# Patient Record
Sex: Female | Born: 1940 | Race: White | Hispanic: No | Smoking: Never smoker
Health system: Southern US, Community
[De-identification: ages and names within clinical notes are randomized; demographics above are authoritative.]

## PROBLEM LIST (undated history)

## (undated) DIAGNOSIS — M1990 Unspecified osteoarthritis, unspecified site: Secondary | ICD-10-CM

## (undated) DIAGNOSIS — R0600 Dyspnea, unspecified: Secondary | ICD-10-CM

## (undated) DIAGNOSIS — N816 Rectocele: Secondary | ICD-10-CM

## (undated) DIAGNOSIS — M2550 Pain in unspecified joint: Secondary | ICD-10-CM

## (undated) DIAGNOSIS — E785 Hyperlipidemia, unspecified: Secondary | ICD-10-CM

## (undated) DIAGNOSIS — F419 Anxiety disorder, unspecified: Secondary | ICD-10-CM

## (undated) DIAGNOSIS — I499 Cardiac arrhythmia, unspecified: Secondary | ICD-10-CM

## (undated) DIAGNOSIS — C801 Malignant (primary) neoplasm, unspecified: Secondary | ICD-10-CM

## (undated) DIAGNOSIS — R002 Palpitations: Secondary | ICD-10-CM

## (undated) DIAGNOSIS — K219 Gastro-esophageal reflux disease without esophagitis: Secondary | ICD-10-CM

## (undated) DIAGNOSIS — E079 Disorder of thyroid, unspecified: Secondary | ICD-10-CM

## (undated) DIAGNOSIS — E039 Hypothyroidism, unspecified: Secondary | ICD-10-CM

## (undated) HISTORY — PX: FINGER SURGERY: SHX640

## (undated) HISTORY — PX: CHOLECYSTECTOMY: SHX55

## (undated) HISTORY — PX: HAMMER TOE SURGERY: SHX385

## (undated) HISTORY — PX: CATARACT EXTRACTION, BILATERAL: SHX1313

## (undated) HISTORY — PX: ROTATOR CUFF REPAIR: SHX139

---

## 1969-08-11 HISTORY — PX: LIVER SURGERY: SHX698

## 1969-08-11 HISTORY — PX: MANDIBLE SURGERY: SHX707

## 1969-08-11 HISTORY — PX: AORTA SURGERY: SHX548

## 1998-04-30 ENCOUNTER — Other Ambulatory Visit: Admission: RE | Admit: 1998-04-30 | Discharge: 1998-04-30 | Payer: Self-pay

## 2011-12-08 DIAGNOSIS — Z79899 Other long term (current) drug therapy: Secondary | ICD-10-CM | POA: Diagnosis not present

## 2011-12-08 DIAGNOSIS — K219 Gastro-esophageal reflux disease without esophagitis: Secondary | ICD-10-CM | POA: Diagnosis not present

## 2011-12-08 DIAGNOSIS — R079 Chest pain, unspecified: Secondary | ICD-10-CM | POA: Diagnosis not present

## 2011-12-08 DIAGNOSIS — R300 Dysuria: Secondary | ICD-10-CM | POA: Diagnosis not present

## 2011-12-08 DIAGNOSIS — E039 Hypothyroidism, unspecified: Secondary | ICD-10-CM | POA: Diagnosis not present

## 2011-12-08 DIAGNOSIS — E785 Hyperlipidemia, unspecified: Secondary | ICD-10-CM | POA: Diagnosis not present

## 2012-04-02 DIAGNOSIS — R1013 Epigastric pain: Secondary | ICD-10-CM | POA: Diagnosis not present

## 2012-05-07 DIAGNOSIS — Z0000 Encounter for general adult medical examination without abnormal findings: Secondary | ICD-10-CM | POA: Diagnosis not present

## 2012-05-07 DIAGNOSIS — R300 Dysuria: Secondary | ICD-10-CM | POA: Diagnosis not present

## 2012-05-07 DIAGNOSIS — E785 Hyperlipidemia, unspecified: Secondary | ICD-10-CM | POA: Diagnosis not present

## 2012-05-07 DIAGNOSIS — Z1231 Encounter for screening mammogram for malignant neoplasm of breast: Secondary | ICD-10-CM | POA: Diagnosis not present

## 2012-05-07 DIAGNOSIS — E039 Hypothyroidism, unspecified: Secondary | ICD-10-CM | POA: Diagnosis not present

## 2012-05-13 DIAGNOSIS — K2900 Acute gastritis without bleeding: Secondary | ICD-10-CM | POA: Diagnosis not present

## 2012-05-13 DIAGNOSIS — R0602 Shortness of breath: Secondary | ICD-10-CM | POA: Diagnosis not present

## 2012-05-13 DIAGNOSIS — K2990 Gastroduodenitis, unspecified, without bleeding: Secondary | ICD-10-CM | POA: Diagnosis not present

## 2012-05-13 DIAGNOSIS — R002 Palpitations: Secondary | ICD-10-CM | POA: Diagnosis not present

## 2012-05-13 DIAGNOSIS — R0789 Other chest pain: Secondary | ICD-10-CM | POA: Diagnosis not present

## 2012-05-14 DIAGNOSIS — K219 Gastro-esophageal reflux disease without esophagitis: Secondary | ICD-10-CM | POA: Diagnosis not present

## 2012-05-14 DIAGNOSIS — F411 Generalized anxiety disorder: Secondary | ICD-10-CM | POA: Diagnosis not present

## 2012-05-14 DIAGNOSIS — M25519 Pain in unspecified shoulder: Secondary | ICD-10-CM | POA: Diagnosis not present

## 2012-05-24 DIAGNOSIS — K219 Gastro-esophageal reflux disease without esophagitis: Secondary | ICD-10-CM | POA: Diagnosis not present

## 2012-05-25 DIAGNOSIS — Z1231 Encounter for screening mammogram for malignant neoplasm of breast: Secondary | ICD-10-CM | POA: Diagnosis not present

## 2012-08-11 HISTORY — PX: OTHER SURGICAL HISTORY: SHX169

## 2012-11-17 DIAGNOSIS — M25519 Pain in unspecified shoulder: Secondary | ICD-10-CM | POA: Diagnosis not present

## 2012-11-22 DIAGNOSIS — M25569 Pain in unspecified knee: Secondary | ICD-10-CM | POA: Diagnosis not present

## 2012-11-22 DIAGNOSIS — M1710 Unilateral primary osteoarthritis, unspecified knee: Secondary | ICD-10-CM | POA: Diagnosis not present

## 2012-11-24 DIAGNOSIS — M25519 Pain in unspecified shoulder: Secondary | ICD-10-CM | POA: Diagnosis not present

## 2012-11-30 DIAGNOSIS — M25519 Pain in unspecified shoulder: Secondary | ICD-10-CM | POA: Diagnosis not present

## 2012-12-03 DIAGNOSIS — M25519 Pain in unspecified shoulder: Secondary | ICD-10-CM | POA: Diagnosis not present

## 2012-12-07 DIAGNOSIS — M25519 Pain in unspecified shoulder: Secondary | ICD-10-CM | POA: Diagnosis not present

## 2012-12-27 DIAGNOSIS — M7120 Synovial cyst of popliteal space [Baker], unspecified knee: Secondary | ICD-10-CM | POA: Diagnosis not present

## 2012-12-27 DIAGNOSIS — M6281 Muscle weakness (generalized): Secondary | ICD-10-CM | POA: Diagnosis not present

## 2012-12-27 DIAGNOSIS — R269 Unspecified abnormalities of gait and mobility: Secondary | ICD-10-CM | POA: Diagnosis not present

## 2012-12-27 DIAGNOSIS — M1710 Unilateral primary osteoarthritis, unspecified knee: Secondary | ICD-10-CM | POA: Diagnosis not present

## 2012-12-31 DIAGNOSIS — M1710 Unilateral primary osteoarthritis, unspecified knee: Secondary | ICD-10-CM | POA: Diagnosis not present

## 2012-12-31 DIAGNOSIS — S83289A Other tear of lateral meniscus, current injury, unspecified knee, initial encounter: Secondary | ICD-10-CM | POA: Diagnosis not present

## 2012-12-31 DIAGNOSIS — M25569 Pain in unspecified knee: Secondary | ICD-10-CM | POA: Diagnosis not present

## 2013-01-07 DIAGNOSIS — S83289A Other tear of lateral meniscus, current injury, unspecified knee, initial encounter: Secondary | ICD-10-CM | POA: Diagnosis not present

## 2013-01-07 DIAGNOSIS — M1710 Unilateral primary osteoarthritis, unspecified knee: Secondary | ICD-10-CM | POA: Diagnosis not present

## 2013-04-05 DIAGNOSIS — M1710 Unilateral primary osteoarthritis, unspecified knee: Secondary | ICD-10-CM | POA: Diagnosis not present

## 2013-04-05 DIAGNOSIS — M25569 Pain in unspecified knee: Secondary | ICD-10-CM | POA: Diagnosis not present

## 2013-04-05 DIAGNOSIS — S83289A Other tear of lateral meniscus, current injury, unspecified knee, initial encounter: Secondary | ICD-10-CM | POA: Diagnosis not present

## 2013-05-26 DIAGNOSIS — Z13820 Encounter for screening for osteoporosis: Secondary | ICD-10-CM | POA: Diagnosis not present

## 2013-05-26 DIAGNOSIS — E785 Hyperlipidemia, unspecified: Secondary | ICD-10-CM | POA: Diagnosis not present

## 2013-05-26 DIAGNOSIS — Z1231 Encounter for screening mammogram for malignant neoplasm of breast: Secondary | ICD-10-CM | POA: Diagnosis not present

## 2013-05-26 DIAGNOSIS — Z0000 Encounter for general adult medical examination without abnormal findings: Secondary | ICD-10-CM | POA: Diagnosis not present

## 2013-05-26 DIAGNOSIS — K5900 Constipation, unspecified: Secondary | ICD-10-CM | POA: Diagnosis not present

## 2013-05-26 DIAGNOSIS — K219 Gastro-esophageal reflux disease without esophagitis: Secondary | ICD-10-CM | POA: Diagnosis not present

## 2013-05-26 DIAGNOSIS — Z79899 Other long term (current) drug therapy: Secondary | ICD-10-CM | POA: Diagnosis not present

## 2013-05-26 DIAGNOSIS — E039 Hypothyroidism, unspecified: Secondary | ICD-10-CM | POA: Diagnosis not present

## 2013-06-27 DIAGNOSIS — D72819 Decreased white blood cell count, unspecified: Secondary | ICD-10-CM | POA: Diagnosis not present

## 2013-07-01 DIAGNOSIS — Z1231 Encounter for screening mammogram for malignant neoplasm of breast: Secondary | ICD-10-CM | POA: Diagnosis not present

## 2013-07-05 DIAGNOSIS — M25569 Pain in unspecified knee: Secondary | ICD-10-CM | POA: Diagnosis not present

## 2013-07-05 DIAGNOSIS — M1710 Unilateral primary osteoarthritis, unspecified knee: Secondary | ICD-10-CM | POA: Diagnosis not present

## 2013-07-05 DIAGNOSIS — S83289A Other tear of lateral meniscus, current injury, unspecified knee, initial encounter: Secondary | ICD-10-CM | POA: Diagnosis not present

## 2013-07-27 DIAGNOSIS — M899 Disorder of bone, unspecified: Secondary | ICD-10-CM | POA: Diagnosis not present

## 2013-07-27 DIAGNOSIS — Z13820 Encounter for screening for osteoporosis: Secondary | ICD-10-CM | POA: Diagnosis not present

## 2013-08-01 DIAGNOSIS — J019 Acute sinusitis, unspecified: Secondary | ICD-10-CM | POA: Diagnosis not present

## 2013-08-01 DIAGNOSIS — J209 Acute bronchitis, unspecified: Secondary | ICD-10-CM | POA: Diagnosis not present

## 2013-08-12 DIAGNOSIS — Z79899 Other long term (current) drug therapy: Secondary | ICD-10-CM | POA: Diagnosis not present

## 2013-08-12 DIAGNOSIS — M949 Disorder of cartilage, unspecified: Secondary | ICD-10-CM | POA: Diagnosis not present

## 2013-08-12 DIAGNOSIS — R7989 Other specified abnormal findings of blood chemistry: Secondary | ICD-10-CM | POA: Diagnosis not present

## 2013-08-12 DIAGNOSIS — M899 Disorder of bone, unspecified: Secondary | ICD-10-CM | POA: Diagnosis not present

## 2013-08-15 DIAGNOSIS — S83289A Other tear of lateral meniscus, current injury, unspecified knee, initial encounter: Secondary | ICD-10-CM | POA: Diagnosis not present

## 2013-08-15 DIAGNOSIS — M1710 Unilateral primary osteoarthritis, unspecified knee: Secondary | ICD-10-CM | POA: Diagnosis not present

## 2013-09-02 DIAGNOSIS — M1710 Unilateral primary osteoarthritis, unspecified knee: Secondary | ICD-10-CM | POA: Diagnosis not present

## 2013-09-09 DIAGNOSIS — M1710 Unilateral primary osteoarthritis, unspecified knee: Secondary | ICD-10-CM | POA: Diagnosis not present

## 2013-09-16 DIAGNOSIS — M1710 Unilateral primary osteoarthritis, unspecified knee: Secondary | ICD-10-CM | POA: Diagnosis not present

## 2013-10-28 DIAGNOSIS — M1710 Unilateral primary osteoarthritis, unspecified knee: Secondary | ICD-10-CM | POA: Diagnosis not present

## 2013-10-28 DIAGNOSIS — M25569 Pain in unspecified knee: Secondary | ICD-10-CM | POA: Diagnosis not present

## 2013-12-11 DIAGNOSIS — L259 Unspecified contact dermatitis, unspecified cause: Secondary | ICD-10-CM | POA: Diagnosis not present

## 2013-12-11 DIAGNOSIS — I8390 Asymptomatic varicose veins of unspecified lower extremity: Secondary | ICD-10-CM | POA: Diagnosis not present

## 2013-12-11 DIAGNOSIS — J069 Acute upper respiratory infection, unspecified: Secondary | ICD-10-CM | POA: Diagnosis not present

## 2013-12-11 DIAGNOSIS — R609 Edema, unspecified: Secondary | ICD-10-CM | POA: Diagnosis not present

## 2013-12-29 DIAGNOSIS — R609 Edema, unspecified: Secondary | ICD-10-CM | POA: Diagnosis not present

## 2014-04-03 DIAGNOSIS — L049 Acute lymphadenitis, unspecified: Secondary | ICD-10-CM | POA: Diagnosis not present

## 2014-04-03 DIAGNOSIS — R1013 Epigastric pain: Secondary | ICD-10-CM | POA: Diagnosis not present

## 2014-05-31 DIAGNOSIS — Z124 Encounter for screening for malignant neoplasm of cervix: Secondary | ICD-10-CM | POA: Diagnosis not present

## 2014-05-31 DIAGNOSIS — E785 Hyperlipidemia, unspecified: Secondary | ICD-10-CM | POA: Diagnosis not present

## 2014-05-31 DIAGNOSIS — K219 Gastro-esophageal reflux disease without esophagitis: Secondary | ICD-10-CM | POA: Diagnosis not present

## 2014-05-31 DIAGNOSIS — E039 Hypothyroidism, unspecified: Secondary | ICD-10-CM | POA: Diagnosis not present

## 2014-05-31 DIAGNOSIS — Z1231 Encounter for screening mammogram for malignant neoplasm of breast: Secondary | ICD-10-CM | POA: Diagnosis not present

## 2014-05-31 DIAGNOSIS — R8290 Unspecified abnormal findings in urine: Secondary | ICD-10-CM | POA: Diagnosis not present

## 2014-05-31 DIAGNOSIS — Z0001 Encounter for general adult medical examination with abnormal findings: Secondary | ICD-10-CM | POA: Diagnosis not present

## 2014-06-06 DIAGNOSIS — M1712 Unilateral primary osteoarthritis, left knee: Secondary | ICD-10-CM | POA: Diagnosis not present

## 2014-06-15 DIAGNOSIS — M25562 Pain in left knee: Secondary | ICD-10-CM | POA: Diagnosis not present

## 2014-06-15 DIAGNOSIS — M1712 Unilateral primary osteoarthritis, left knee: Secondary | ICD-10-CM | POA: Diagnosis not present

## 2014-06-15 DIAGNOSIS — R262 Difficulty in walking, not elsewhere classified: Secondary | ICD-10-CM | POA: Diagnosis not present

## 2014-06-27 DIAGNOSIS — L578 Other skin changes due to chronic exposure to nonionizing radiation: Secondary | ICD-10-CM | POA: Diagnosis not present

## 2014-06-27 DIAGNOSIS — L570 Actinic keratosis: Secondary | ICD-10-CM | POA: Diagnosis not present

## 2014-06-29 DIAGNOSIS — R262 Difficulty in walking, not elsewhere classified: Secondary | ICD-10-CM | POA: Diagnosis not present

## 2014-06-29 DIAGNOSIS — M1712 Unilateral primary osteoarthritis, left knee: Secondary | ICD-10-CM | POA: Diagnosis not present

## 2014-06-29 DIAGNOSIS — M25562 Pain in left knee: Secondary | ICD-10-CM | POA: Diagnosis not present

## 2014-07-04 DIAGNOSIS — M1712 Unilateral primary osteoarthritis, left knee: Secondary | ICD-10-CM | POA: Diagnosis not present

## 2014-07-04 DIAGNOSIS — R262 Difficulty in walking, not elsewhere classified: Secondary | ICD-10-CM | POA: Diagnosis not present

## 2014-07-04 DIAGNOSIS — M25562 Pain in left knee: Secondary | ICD-10-CM | POA: Diagnosis not present

## 2014-07-11 DIAGNOSIS — M1712 Unilateral primary osteoarthritis, left knee: Secondary | ICD-10-CM | POA: Diagnosis not present

## 2014-07-11 DIAGNOSIS — R262 Difficulty in walking, not elsewhere classified: Secondary | ICD-10-CM | POA: Diagnosis not present

## 2014-07-11 DIAGNOSIS — M25562 Pain in left knee: Secondary | ICD-10-CM | POA: Diagnosis not present

## 2014-07-18 DIAGNOSIS — Z1231 Encounter for screening mammogram for malignant neoplasm of breast: Secondary | ICD-10-CM | POA: Diagnosis not present

## 2014-07-25 DIAGNOSIS — M1712 Unilateral primary osteoarthritis, left knee: Secondary | ICD-10-CM | POA: Diagnosis not present

## 2014-07-25 DIAGNOSIS — R262 Difficulty in walking, not elsewhere classified: Secondary | ICD-10-CM | POA: Diagnosis not present

## 2014-07-25 DIAGNOSIS — M25562 Pain in left knee: Secondary | ICD-10-CM | POA: Diagnosis not present

## 2014-07-28 DIAGNOSIS — M1712 Unilateral primary osteoarthritis, left knee: Secondary | ICD-10-CM | POA: Diagnosis not present

## 2014-07-28 DIAGNOSIS — R262 Difficulty in walking, not elsewhere classified: Secondary | ICD-10-CM | POA: Diagnosis not present

## 2014-07-28 DIAGNOSIS — M25562 Pain in left knee: Secondary | ICD-10-CM | POA: Diagnosis not present

## 2014-08-17 DIAGNOSIS — J029 Acute pharyngitis, unspecified: Secondary | ICD-10-CM | POA: Diagnosis not present

## 2014-08-17 DIAGNOSIS — F419 Anxiety disorder, unspecified: Secondary | ICD-10-CM | POA: Diagnosis not present

## 2014-08-25 DIAGNOSIS — H25812 Combined forms of age-related cataract, left eye: Secondary | ICD-10-CM | POA: Diagnosis not present

## 2014-09-18 DIAGNOSIS — H6123 Impacted cerumen, bilateral: Secondary | ICD-10-CM | POA: Diagnosis not present

## 2014-09-25 DIAGNOSIS — M9902 Segmental and somatic dysfunction of thoracic region: Secondary | ICD-10-CM | POA: Diagnosis not present

## 2014-09-25 DIAGNOSIS — M9903 Segmental and somatic dysfunction of lumbar region: Secondary | ICD-10-CM | POA: Diagnosis not present

## 2014-09-25 DIAGNOSIS — M545 Low back pain: Secondary | ICD-10-CM | POA: Diagnosis not present

## 2014-09-25 DIAGNOSIS — M9905 Segmental and somatic dysfunction of pelvic region: Secondary | ICD-10-CM | POA: Diagnosis not present

## 2014-09-26 DIAGNOSIS — M9903 Segmental and somatic dysfunction of lumbar region: Secondary | ICD-10-CM | POA: Diagnosis not present

## 2014-09-26 DIAGNOSIS — M9905 Segmental and somatic dysfunction of pelvic region: Secondary | ICD-10-CM | POA: Diagnosis not present

## 2014-09-26 DIAGNOSIS — M545 Low back pain: Secondary | ICD-10-CM | POA: Diagnosis not present

## 2014-09-26 DIAGNOSIS — M9902 Segmental and somatic dysfunction of thoracic region: Secondary | ICD-10-CM | POA: Diagnosis not present

## 2014-09-27 DIAGNOSIS — M9903 Segmental and somatic dysfunction of lumbar region: Secondary | ICD-10-CM | POA: Diagnosis not present

## 2014-09-27 DIAGNOSIS — M545 Low back pain: Secondary | ICD-10-CM | POA: Diagnosis not present

## 2014-09-27 DIAGNOSIS — M9902 Segmental and somatic dysfunction of thoracic region: Secondary | ICD-10-CM | POA: Diagnosis not present

## 2014-09-27 DIAGNOSIS — M9905 Segmental and somatic dysfunction of pelvic region: Secondary | ICD-10-CM | POA: Diagnosis not present

## 2014-10-02 DIAGNOSIS — M9902 Segmental and somatic dysfunction of thoracic region: Secondary | ICD-10-CM | POA: Diagnosis not present

## 2014-10-02 DIAGNOSIS — M545 Low back pain: Secondary | ICD-10-CM | POA: Diagnosis not present

## 2014-10-02 DIAGNOSIS — M9905 Segmental and somatic dysfunction of pelvic region: Secondary | ICD-10-CM | POA: Diagnosis not present

## 2014-10-02 DIAGNOSIS — M9903 Segmental and somatic dysfunction of lumbar region: Secondary | ICD-10-CM | POA: Diagnosis not present

## 2014-10-03 DIAGNOSIS — K219 Gastro-esophageal reflux disease without esophagitis: Secondary | ICD-10-CM | POA: Diagnosis not present

## 2014-10-03 DIAGNOSIS — Z79899 Other long term (current) drug therapy: Secondary | ICD-10-CM | POA: Diagnosis not present

## 2014-10-03 DIAGNOSIS — H259 Unspecified age-related cataract: Secondary | ICD-10-CM | POA: Diagnosis not present

## 2014-10-03 DIAGNOSIS — E039 Hypothyroidism, unspecified: Secondary | ICD-10-CM | POA: Diagnosis not present

## 2014-10-03 DIAGNOSIS — H25812 Combined forms of age-related cataract, left eye: Secondary | ICD-10-CM | POA: Diagnosis not present

## 2014-10-03 DIAGNOSIS — M810 Age-related osteoporosis without current pathological fracture: Secondary | ICD-10-CM | POA: Diagnosis not present

## 2014-10-30 DIAGNOSIS — Z9842 Cataract extraction status, left eye: Secondary | ICD-10-CM | POA: Diagnosis not present

## 2014-10-30 DIAGNOSIS — H5203 Hypermetropia, bilateral: Secondary | ICD-10-CM | POA: Diagnosis not present

## 2014-10-30 DIAGNOSIS — H524 Presbyopia: Secondary | ICD-10-CM | POA: Diagnosis not present

## 2014-10-30 DIAGNOSIS — H52223 Regular astigmatism, bilateral: Secondary | ICD-10-CM | POA: Diagnosis not present

## 2014-10-30 DIAGNOSIS — H20012 Primary iridocyclitis, left eye: Secondary | ICD-10-CM | POA: Diagnosis not present

## 2014-10-30 DIAGNOSIS — Z961 Presence of intraocular lens: Secondary | ICD-10-CM | POA: Diagnosis not present

## 2014-10-30 DIAGNOSIS — H18232 Secondary corneal edema, left eye: Secondary | ICD-10-CM | POA: Diagnosis not present

## 2014-11-09 DIAGNOSIS — M1712 Unilateral primary osteoarthritis, left knee: Secondary | ICD-10-CM | POA: Diagnosis not present

## 2014-11-22 DIAGNOSIS — M179 Osteoarthritis of knee, unspecified: Secondary | ICD-10-CM | POA: Diagnosis not present

## 2014-11-22 DIAGNOSIS — Z01818 Encounter for other preprocedural examination: Secondary | ICD-10-CM | POA: Diagnosis not present

## 2014-11-22 DIAGNOSIS — B372 Candidiasis of skin and nail: Secondary | ICD-10-CM | POA: Diagnosis not present

## 2014-12-13 DIAGNOSIS — K859 Acute pancreatitis, unspecified: Secondary | ICD-10-CM | POA: Diagnosis not present

## 2014-12-13 DIAGNOSIS — R079 Chest pain, unspecified: Secondary | ICD-10-CM | POA: Diagnosis not present

## 2014-12-13 DIAGNOSIS — S3991XA Unspecified injury of abdomen, initial encounter: Secondary | ICD-10-CM | POA: Diagnosis not present

## 2014-12-13 DIAGNOSIS — S3993XA Unspecified injury of pelvis, initial encounter: Secondary | ICD-10-CM | POA: Diagnosis not present

## 2014-12-13 DIAGNOSIS — R1013 Epigastric pain: Secondary | ICD-10-CM | POA: Diagnosis not present

## 2014-12-18 DIAGNOSIS — K859 Acute pancreatitis, unspecified: Secondary | ICD-10-CM | POA: Diagnosis not present

## 2014-12-18 DIAGNOSIS — R112 Nausea with vomiting, unspecified: Secondary | ICD-10-CM | POA: Diagnosis not present

## 2014-12-18 DIAGNOSIS — R109 Unspecified abdominal pain: Secondary | ICD-10-CM | POA: Diagnosis not present

## 2014-12-26 DIAGNOSIS — R109 Unspecified abdominal pain: Secondary | ICD-10-CM | POA: Diagnosis not present

## 2014-12-26 DIAGNOSIS — R748 Abnormal levels of other serum enzymes: Secondary | ICD-10-CM | POA: Diagnosis not present

## 2014-12-26 DIAGNOSIS — R12 Heartburn: Secondary | ICD-10-CM | POA: Diagnosis not present

## 2014-12-27 DIAGNOSIS — R109 Unspecified abdominal pain: Secondary | ICD-10-CM | POA: Diagnosis not present

## 2014-12-27 DIAGNOSIS — R1013 Epigastric pain: Secondary | ICD-10-CM | POA: Diagnosis not present

## 2014-12-27 DIAGNOSIS — K2950 Unspecified chronic gastritis without bleeding: Secondary | ICD-10-CM | POA: Diagnosis not present

## 2014-12-27 DIAGNOSIS — K319 Disease of stomach and duodenum, unspecified: Secondary | ICD-10-CM | POA: Diagnosis not present

## 2015-01-04 DIAGNOSIS — M1712 Unilateral primary osteoarthritis, left knee: Secondary | ICD-10-CM | POA: Diagnosis not present

## 2015-01-04 DIAGNOSIS — M25562 Pain in left knee: Secondary | ICD-10-CM | POA: Diagnosis not present

## 2015-02-02 ENCOUNTER — Ambulatory Visit: Payer: Self-pay | Admitting: Orthopedic Surgery

## 2015-02-02 DIAGNOSIS — M1712 Unilateral primary osteoarthritis, left knee: Secondary | ICD-10-CM | POA: Diagnosis not present
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
## 2015-02-08 DIAGNOSIS — M9903 Segmental and somatic dysfunction of lumbar region: Secondary | ICD-10-CM | POA: Diagnosis not present

## 2015-02-08 DIAGNOSIS — M545 Low back pain: Secondary | ICD-10-CM | POA: Diagnosis not present

## 2015-02-08 DIAGNOSIS — M9905 Segmental and somatic dysfunction of pelvic region: Secondary | ICD-10-CM | POA: Diagnosis not present

## 2015-02-08 DIAGNOSIS — M9902 Segmental and somatic dysfunction of thoracic region: Secondary | ICD-10-CM | POA: Diagnosis not present

## 2015-02-12 DIAGNOSIS — M546 Pain in thoracic spine: Secondary | ICD-10-CM | POA: Diagnosis not present

## 2015-02-15 ENCOUNTER — Ambulatory Visit: Payer: Self-pay | Admitting: Orthopedic Surgery

## 2015-02-15 NOTE — Progress Notes (Signed)
Preoperative surgical orders have been place into the Epic hospital system for Robyn Lambert on 02/15/2015, 6:16 PM  by Mickel Crow for surgery on 02-28-2015.  Preop Total Knee orders including Experal, IV Tylenol, and IV Decadron as long as there are no contraindications to the above medications. Arlee Muslim, PA-C

## 2015-02-15 NOTE — Progress Notes (Signed)
Please put orders in Epic surgery 02-28-15 pre op 02-21-15 Thanks

## 2015-02-20 ENCOUNTER — Other Ambulatory Visit (HOSPITAL_COMMUNITY): Payer: Self-pay | Admitting: *Deleted

## 2015-02-20 DIAGNOSIS — R002 Palpitations: Secondary | ICD-10-CM

## 2015-02-20 NOTE — Patient Instructions (Addendum)
YOUR PROCEDURE IS SCHEDULED ON : 02/28/15  REPORT TO [facility] MAIN ENTRANCE FOLLOW SIGNS TO EAST ELEVATOR - GO TO 3rd FLOOR CHECK IN AT 3 EAST NURSES STATION (SHORT STAY) AT:  10:45 AM  CALL THIS NUMBER IF YOU HAVE PROBLEMS THE MORNING OF SURGERY [phone]  REMEMBER:ONLY 1 PER PERSON MAY GO TO SHORT STAY WITH YOU TO GET READY THE MORNING OF YOUR SURGERY  DO NOT EAT FOOD  AFTER MIDNIGHT  MAY HAVE CLEAR LIQUIDS UNTIL 6:45 AM  TAKE THESE MEDICINES THE MORNING OF SURGERY: SYNTHROID / MAY TAKE TRAMADOL IF NEEDED     CLEAR LIQUID DIET   Foods Allowed                                                                     Foods Excluded  Coffee and tea, regular and decaf                             liquids that you cannot  Plain Jell-O in any flavor                                             see through such as: Fruit ices (not with fruit pulp)                                     milk, soups, orange juice  Iced Popsicles                                                 All solid food Carbonated beverages, regular and diet                                    Cranberry, grape and apple juices Sports drinks like Gatorade Lightly seasoned clear broth or consume(fat free) Sugar, honey syrup   _____________________________________________________________________    YOU MAY NOT HAVE ANY METAL ON YOUR BODY INCLUDING HAIR PINS AND PIERCING'S. DO NOT WEAR JEWELRY, MAKEUP, LOTIONS, POWDERS OR PERFUMES. DO NOT WEAR NAIL POLISH. DO NOT SHAVE 48 HRS PRIOR TO SURGERY. MEN MAY SHAVE FACE AND NECK.  DO NOT [place]. [facility] IS NOT RESPONSIBLE FOR VALUABLES.  CONTACTS, DENTURES OR PARTIALS MAY NOT BE WORN TO SURGERY. LEAVE SUITCASE IN CAR. CAN BE BROUGHT TO ROOM AFTER SURGERY.  PATIENTS DISCHARGED THE DAY OF SURGERY WILL NOT BE ALLOWED TO DRIVE HOME.  PLEASE READ OVER THE FOLLOWING INSTRUCTION  SHEETS _________________________________________________________________________________                                          [place] - PREPARING FOR SURGERY  Before surgery, you can play an  important role.  Because skin is not sterile, your skin needs to be as free of germs as possible.  You can reduce the number of germs on your skin by washing with CHG (chlorahexidine gluconate) soap before surgery.  CHG is an antiseptic cleaner which kills germs and bonds with the skin to continue killing germs even after washing. Please DO NOT use if you have an allergy to CHG or antibacterial soaps.  If your skin becomes reddened/irritated stop using the CHG and inform your nurse when you arrive at Short Stay. Do not shave (including legs and underarms) for at least 48 hours prior to the first CHG shower.  You may shave your face. Please follow these instructions carefully:   1.  Shower with CHG Soap the night before surgery and the  morning of Surgery.   2.  If you choose to wash your hair, wash your hair first as usual with your  normal  Shampoo.   3.  After you shampoo, rinse your hair and body thoroughly to remove the  shampoo.                                         4.  Use CHG as you would any other liquid soap.  You can apply chg directly  to the skin and wash . Gently wash with scrungie or clean wascloth    5.  Apply the CHG Soap to your body ONLY FROM THE NECK DOWN.   Do not use on open                           Wound or open sores. Avoid contact with eyes, ears mouth and genitals (private parts).                        Genitals (private parts) with your normal soap.              6.  Wash thoroughly, paying special attention to the area where your surgery  will be performed.   7.  Thoroughly rinse your body with warm water from the neck down.   8.  DO NOT shower/wash with your normal soap after using and rinsing off  the CHG Soap .                9.  Pat yourself dry with a clean  towel.             10.  Wear clean night clothes to bed after shower             11.  Place clean sheets on your bed the night of your first shower and do not  sleep with pets.  Day of Surgery : Do not apply any lotions/deodorants the morning of surgery.  Please wear clean clothes to the hospital/surgery center.  FAILURE TO FOLLOW THESE INSTRUCTIONS MAY RESULT IN THE CANCELLATION OF YOUR SURGERY    PATIENT SIGNATURE_________________________________  ______________________________________________________________________     [name]  An incentive spirometer is a tool that can help keep your lungs clear and active. This tool measures how well you are filling your lungs with each breath. Taking long deep breaths may help reverse or decrease the chance of developing breathing (pulmonary) problems (especially infection) following:  A long period of time when you are  unable to move or be active. BEFORE THE PROCEDURE   If the spirometer includes an indicator to show your best effort, your nurse or respiratory therapist will set it to a desired goal.  If possible, sit up straight or lean slightly forward. Try not to slouch.  Hold the incentive spirometer in an upright position. INSTRUCTIONS FOR USE   Sit on the edge of your bed if possible, or sit up as far as you can in bed or on a chair.  Hold the incentive spirometer in an upright position.  Breathe out normally.  Place the mouthpiece in your mouth and seal your lips tightly around it.  Breathe in slowly and as deeply as possible, raising the piston or the ball toward the top of the column.  Hold your breath for 3-5 seconds or for as long as possible. Allow the piston or ball to fall to the bottom of the column.  Remove the mouthpiece from your mouth and breathe out normally.  Rest for a few seconds and repeat Steps 1 through 7 at least 10 times every 1-2 hours when you are awake. Take your time and take a few  normal breaths between deep breaths.  The spirometer may include an indicator to show your best effort. Use the indicator as a goal to work toward during each repetition.  After each set of 10 deep breaths, practice coughing to be sure your lungs are clear. If you have an incision (the cut made at the time of surgery), support your incision when coughing by placing a pillow or rolled up towels firmly against it. Once you are able to get out of bed, walk around indoors and cough well. You may stop using the incentive spirometer when instructed by your caregiver.  RISKS AND COMPLICATIONS  Take your time so you do not get dizzy or light-headed.  If you are in pain, you may need to take or ask for pain medication before doing incentive spirometry. It is harder to take a deep breath if you are having pain. AFTER USE  Rest and breathe slowly and easily.  It can be helpful to keep track of a log of your progress. Your caregiver can provide you with a simple table to help with this. If you are using the spirometer at home, follow these instructions: [place] IF:   You are having difficultly using the spirometer.  You have trouble using the spirometer as often as instructed.  Your pain medication is not giving enough relief while using the spirometer.  You develop fever of 100.5 F (38.1 C) or higher. SEEK IMMEDIATE MEDICAL CARE IF:   You cough up bloody sputum that had not been present before.  You develop fever of 102 F (38.9 C) or greater.  You develop worsening pain at or near the incision site. MAKE SURE YOU:   Understand these instructions.  Will watch your condition.  Will get help right away if you are not doing well or get worse. Document Released: 12/08/2006 Document Revised: 10/20/2011 Document Reviewed: 02/08/2007 Exit[employer] Patient Information 2014 [employer], [place].   ________________________________________________________________________  WHAT IS A BLOOD  TRANSFUSION? Blood Transfusion Information  A transfusion is the replacement of blood or some of its parts. Blood is made up of multiple cells which provide different functions.  Red blood cells carry oxygen and are used for blood loss replacement.  White blood cells fight against infection.  Platelets control bleeding.  Plasma helps clot blood.  Other blood products  are available for specialized needs, such as hemophilia or other clotting disorders. BEFORE THE TRANSFUSION  Who gives blood for transfusions?   Healthy volunteers who are fully evaluated to make sure their blood is safe. This is blood bank blood. Transfusion therapy is the safest it has ever been in the practice of medicine. Before blood is taken from a donor, a complete history is taken to make sure that person has no history of diseases nor engages in risky social behavior (examples are intravenous drug use or sexual activity with multiple partners). The donor's travel history is screened to minimize risk of transmitting infections, such as malaria. The donated blood is tested for signs of infectious diseases, such as HIV and hepatitis. The blood is then tested to be sure it is compatible with you in order to minimize the chance of a transfusion reaction. If you or a relative donates blood, this is often done in anticipation of surgery and is not appropriate for emergency situations. It takes many days to process the donated blood. RISKS AND COMPLICATIONS Although transfusion therapy is very safe and saves many lives, the main dangers of transfusion include:   Getting an infectious disease.  Developing a transfusion reaction. This is an allergic reaction to something in the blood you were given. Every precaution is taken to prevent this. The decision to have a blood transfusion has been considered carefully by your caregiver before blood is given. Blood is not given unless the benefits outweigh the risks. AFTER THE  TRANSFUSION  Right after receiving a blood transfusion, you will usually feel much better and more energetic. This is especially true if your red blood cells have gotten low (anemic). The transfusion raises the level of the red blood cells which carry oxygen, and this usually causes an energy increase.  The nurse administering the transfusion will monitor you carefully for complications. HOME CARE INSTRUCTIONS  No special instructions are needed after a transfusion. You may find your energy is better. Speak with your caregiver about any limitations on activity for underlying diseases you may have. SEEK MEDICAL CARE IF:   Your condition is not improving after your transfusion.  You develop redness or irritation at the intravenous (IV) site. SEEK IMMEDIATE MEDICAL CARE IF:  Any of the following symptoms occur over the next 12 hours:  Shaking chills.  You have a temperature by mouth above 102 F (38.9 C), not controlled by medicine.  Chest, back, or muscle pain.  People around you feel you are not acting correctly or are confused.  Shortness of breath or difficulty breathing.  Dizziness and fainting.  You get a rash or develop hives.  You have a decrease in urine output.  Your urine turns a dark color or changes to pink, red, or brown. Any of the following symptoms occur over the next 10 days:  You have a temperature by mouth above 102 F (38.9 C), not controlled by medicine.  Shortness of breath.  Weakness after normal activity.  The white part of the eye turns yellow (jaundice).  You have a decrease in the amount of urine or are urinating less often.  Your urine turns a dark color or changes to pink, red, or brown. Document Released: 07/25/2000 Document Revised: 10/20/2011 Document Reviewed: 03/13/2008 [facility] Patient Information 2014 [place], [place].  _______________________________________________________________________

## 2015-02-21 ENCOUNTER — Encounter (HOSPITAL_COMMUNITY)
Admission: RE | Admit: 2015-02-21 | Discharge: 2015-02-21 | Disposition: A | Discharge status: Home / Self Care | Payer: Medicare Other | Source: Ambulatory Visit | Attending: Orthopedic Surgery | Admitting: Orthopedic Surgery

## 2015-02-21 ENCOUNTER — Encounter (HOSPITAL_COMMUNITY): Payer: Self-pay

## 2015-02-21 DIAGNOSIS — Z01812 Encounter for preprocedural laboratory examination: Secondary | ICD-10-CM | POA: Insufficient documentation

## 2015-02-21 DIAGNOSIS — Z79899 Other long term (current) drug therapy: Secondary | ICD-10-CM | POA: Diagnosis not present

## 2015-02-21 DIAGNOSIS — Z0183 Encounter for blood typing: Secondary | ICD-10-CM | POA: Insufficient documentation

## 2015-02-21 DIAGNOSIS — M1712 Unilateral primary osteoarthritis, left knee: Secondary | ICD-10-CM | POA: Insufficient documentation

## 2015-02-21 DIAGNOSIS — Z7982 Long term (current) use of aspirin: Secondary | ICD-10-CM | POA: Insufficient documentation

## 2015-02-21 HISTORY — DX: Hyperlipidemia, unspecified: E78.5

## 2015-02-21 HISTORY — DX: Unspecified osteoarthritis, unspecified site: M19.90

## 2015-02-21 HISTORY — DX: Rectocele: N81.6

## 2015-02-21 HISTORY — DX: Gastro-esophageal reflux disease without esophagitis: K21.9

## 2015-02-21 HISTORY — DX: Disorder of thyroid, unspecified: E07.9

## 2015-02-21 HISTORY — DX: Pain in unspecified joint: M25.50

## 2015-02-21 HISTORY — DX: Anxiety disorder, unspecified: F41.9

## 2015-02-21 LAB — URINALYSIS, ROUTINE W REFLEX MICROSCOPIC
Bilirubin Urine: NEGATIVE
GLUCOSE, UA: NEGATIVE mg/dL
Hgb urine dipstick: NEGATIVE
Ketones, ur: NEGATIVE mg/dL
LEUKOCYTES UA: NEGATIVE
Nitrite: NEGATIVE
PH: 7 (ref 5.0–8.0)
Protein, ur: NEGATIVE mg/dL
SPECIFIC GRAVITY, URINE: 1.005 (ref 1.005–1.030)
UROBILINOGEN UA: 0.2 mg/dL (ref 0.0–1.0)

## 2015-02-21 LAB — PROTIME-INR
INR: 0.97 (ref 0.00–1.49)
Prothrombin Time: 13.1 seconds (ref 11.6–15.2)

## 2015-02-21 LAB — COMPREHENSIVE METABOLIC PANEL
ALT: 14 U/L (ref 14–54)
ANION GAP: 8 (ref 5–15)
AST: 21 U/L (ref 15–41)
Albumin: 4.4 g/dL (ref 3.5–5.0)
Alkaline Phosphatase: 121 U/L (ref 38–126)
BUN: 14 mg/dL (ref 6–20)
CALCIUM: 9.6 mg/dL (ref 8.9–10.3)
CHLORIDE: 103 mmol/L (ref 101–111)
CO2: 30 mmol/L (ref 22–32)
Creatinine, Ser: 0.63 mg/dL (ref 0.44–1.00)
GFR calc Af Amer: >60 mL/min (ref >60)
GFR calc non Af Amer: >60 mL/min (ref >60)
GLUCOSE: 109 mg/dL — AB (ref 65–99)
POTASSIUM: 4.3 mmol/L (ref 3.5–5.1)
Sodium: 141 mmol/L (ref 135–145)
TOTAL PROTEIN: 7.4 g/dL (ref 6.5–8.1)
Total Bilirubin: 0.4 mg/dL (ref 0.3–1.2)

## 2015-02-21 LAB — CBC
HEMATOCRIT: 39.1 % (ref 36.0–46.0)
Hemoglobin: 12.6 g/dL (ref 12.0–15.0)
MCH: 28.7 pg (ref 26.0–34.0)
MCHC: 32.2 g/dL (ref 30.0–36.0)
MCV: 89.1 fL (ref 78.0–100.0)
Platelets: 261 10*3/uL (ref 150–400)
RBC: 4.39 MIL/uL (ref 3.87–5.11)
RDW: 13.4 % (ref 11.5–15.5)
WBC: 4.8 10*3/uL (ref 4.0–10.5)

## 2015-02-21 LAB — APTT: aPTT: 34 seconds (ref 24–37)

## 2015-02-21 LAB — SURGICAL PCR SCREEN
MRSA, PCR: NEGATIVE
Staphylococcus aureus: NEGATIVE

## 2015-02-21 LAB — ABO/RH: ABO/RH(D): O POS

## 2015-02-28 ENCOUNTER — Encounter (HOSPITAL_COMMUNITY): Payer: Self-pay | Admitting: Anesthesiology

## 2015-02-28 ENCOUNTER — Encounter (HOSPITAL_COMMUNITY)
Admission: AD | Disposition: A | Discharge status: Skilled Nursing Facility | Payer: Self-pay | Source: Ambulatory Visit | Attending: Orthopedic Surgery

## 2015-02-28 ENCOUNTER — Inpatient Hospital Stay (HOSPITAL_COMMUNITY): Payer: Medicare Other | Admitting: Anesthesiology

## 2015-02-28 ENCOUNTER — Inpatient Hospital Stay (HOSPITAL_COMMUNITY)
Admission: AD | Admit: 2015-02-28 | Discharge: 2015-03-02 | DRG: 470 | Disposition: A | Discharge status: Skilled Nursing Facility | Payer: Medicare Other | Source: Ambulatory Visit | Attending: Orthopedic Surgery | Admitting: Orthopedic Surgery

## 2015-02-28 DIAGNOSIS — M1712 Unilateral primary osteoarthritis, left knee: Secondary | ICD-10-CM | POA: Diagnosis present

## 2015-02-28 DIAGNOSIS — E785 Hyperlipidemia, unspecified: Secondary | ICD-10-CM | POA: Diagnosis present

## 2015-02-28 DIAGNOSIS — E780 Pure hypercholesterolemia: Secondary | ICD-10-CM | POA: Diagnosis present

## 2015-02-28 DIAGNOSIS — Z882 Allergy status to sulfonamides status: Secondary | ICD-10-CM

## 2015-02-28 DIAGNOSIS — Z7982 Long term (current) use of aspirin: Secondary | ICD-10-CM | POA: Diagnosis not present

## 2015-02-28 DIAGNOSIS — F419 Anxiety disorder, unspecified: Secondary | ICD-10-CM | POA: Diagnosis present

## 2015-02-28 DIAGNOSIS — M2550 Pain in unspecified joint: Secondary | ICD-10-CM | POA: Diagnosis not present

## 2015-02-28 DIAGNOSIS — Z886 Allergy status to analgesic agent status: Secondary | ICD-10-CM

## 2015-02-28 DIAGNOSIS — M179 Osteoarthritis of knee, unspecified: Secondary | ICD-10-CM | POA: Diagnosis not present

## 2015-02-28 DIAGNOSIS — K219 Gastro-esophageal reflux disease without esophagitis: Secondary | ICD-10-CM | POA: Diagnosis present

## 2015-02-28 DIAGNOSIS — Z96652 Presence of left artificial knee joint: Secondary | ICD-10-CM | POA: Diagnosis not present

## 2015-02-28 DIAGNOSIS — M1710 Unilateral primary osteoarthritis, unspecified knee: Secondary | ICD-10-CM | POA: Diagnosis present

## 2015-02-28 DIAGNOSIS — Z471 Aftercare following joint replacement surgery: Secondary | ICD-10-CM | POA: Diagnosis not present

## 2015-02-28 DIAGNOSIS — M25562 Pain in left knee: Secondary | ICD-10-CM | POA: Diagnosis not present

## 2015-02-28 HISTORY — PX: TOTAL KNEE ARTHROPLASTY: SHX125

## 2015-02-28 LAB — TYPE AND SCREEN
ABO/RH(D): O POS
ANTIBODY SCREEN: NEGATIVE

## 2015-02-28 SURGERY — ARTHROPLASTY, KNEE, TOTAL
Anesthesia: Spinal | Site: Knee | Laterality: Left

## 2015-02-28 MED ORDER — MIDAZOLAM HCL 2 MG/2ML IJ SOLN
INTRAMUSCULAR | Status: AC
  Filled 2015-02-28: qty 2

## 2015-02-28 MED ORDER — SODIUM CHLORIDE 0.9 % IJ SOLN
INTRAMUSCULAR | Status: AC
  Filled 2015-02-28: qty 10

## 2015-02-28 MED ORDER — METOCLOPRAMIDE HCL 5 MG/ML IJ SOLN: 5.0000 mg | Freq: Three times a day (TID) | INTRAMUSCULAR | Status: DC | PRN

## 2015-02-28 MED ORDER — LACTATED RINGERS IV SOLN
INTRAVENOUS | Status: DC | PRN
  Administered 2015-02-28: 12:00:00 via INTRAVENOUS

## 2015-02-28 MED ORDER — CEFAZOLIN SODIUM-DEXTROSE 2-3 GM-% IV SOLR
2.0000 g | Freq: Four times a day (QID) | INTRAVENOUS | Status: AC
  Administered 2015-02-28 – 2015-03-01 (×2): 2 g via INTRAVENOUS
  Filled 2015-02-28 (×2): qty 50

## 2015-02-28 MED ORDER — SODIUM CHLORIDE 0.9 % IJ SOLN
INTRAMUSCULAR | Status: AC
  Filled 2015-02-28: qty 50

## 2015-02-28 MED ORDER — ONDANSETRON HCL 4 MG/2ML IJ SOLN: 4.0000 mg | Freq: Four times a day (QID) | INTRAMUSCULAR | Status: DC | PRN

## 2015-02-28 MED ORDER — BUPIVACAINE LIPOSOME 1.3 % IJ SUSP
20.0000 mL | Freq: Once | INTRAMUSCULAR | Status: DC
  Filled 2015-02-28: qty 20

## 2015-02-28 MED ORDER — CHLORHEXIDINE GLUCONATE 4 % EX LIQD: 60.0000 mL | Freq: Once | CUTANEOUS | Status: DC

## 2015-02-28 MED ORDER — DIPHENHYDRAMINE HCL 12.5 MG/5ML PO ELIX: 12.5000 mg | ORAL_SOLUTION | ORAL | Status: DC | PRN

## 2015-02-28 MED ORDER — FENTANYL CITRATE (PF) 100 MCG/2ML IJ SOLN
INTRAMUSCULAR | Status: DC | PRN
  Administered 2015-02-28 (×2): 25 ug via INTRAVENOUS
  Administered 2015-02-28: 50 ug via INTRAVENOUS

## 2015-02-28 MED ORDER — ACETAMINOPHEN 650 MG RE SUPP: 650.0000 mg | Freq: Four times a day (QID) | RECTAL | Status: DC | PRN

## 2015-02-28 MED ORDER — DOCUSATE SODIUM 100 MG PO CAPS
100.0000 mg | ORAL_CAPSULE | Freq: Two times a day (BID) | ORAL | Status: DC
  Administered 2015-02-28 – 2015-03-02 (×4): 100 mg via ORAL

## 2015-02-28 MED ORDER — POLYETHYLENE GLYCOL 3350 17 G PO PACK: 17.0000 g | PACK | Freq: Every day | ORAL | Status: DC | PRN

## 2015-02-28 MED ORDER — PHENOL 1.4 % MT LIQD: 1.0000 | OROMUCOSAL | Status: DC | PRN

## 2015-02-28 MED ORDER — MIDAZOLAM HCL 5 MG/5ML IJ SOLN
INTRAMUSCULAR | Status: DC | PRN
  Administered 2015-02-28: 2 mg via INTRAVENOUS

## 2015-02-28 MED ORDER — ONDANSETRON HCL 4 MG/2ML IJ SOLN
INTRAMUSCULAR | Status: DC | PRN
  Administered 2015-02-28: 4 mg via INTRAVENOUS

## 2015-02-28 MED ORDER — BUPIVACAINE HCL 0.25 % IJ SOLN
INTRAMUSCULAR | Status: DC | PRN
  Administered 2015-02-28: 30 mL

## 2015-02-28 MED ORDER — DEXAMETHASONE SODIUM PHOSPHATE 10 MG/ML IJ SOLN
10.0000 mg | Freq: Once | INTRAMUSCULAR | Status: AC
  Administered 2015-02-28: 10 mg via INTRAVENOUS

## 2015-02-28 MED ORDER — ACETAMINOPHEN 325 MG PO TABS: 650.0000 mg | ORAL_TABLET | Freq: Four times a day (QID) | ORAL | Status: DC | PRN

## 2015-02-28 MED ORDER — HYDROMORPHONE HCL 1 MG/ML IJ SOLN: 0.2500 mg | INTRAMUSCULAR | Status: DC | PRN

## 2015-02-28 MED ORDER — CEFAZOLIN SODIUM-DEXTROSE 2-3 GM-% IV SOLR
INTRAVENOUS | Status: AC
  Filled 2015-02-28: qty 50

## 2015-02-28 MED ORDER — ONDANSETRON HCL 4 MG/2ML IJ SOLN
INTRAMUSCULAR | Status: AC
  Filled 2015-02-28: qty 2

## 2015-02-28 MED ORDER — DEXAMETHASONE SODIUM PHOSPHATE 10 MG/ML IJ SOLN
INTRAMUSCULAR | Status: AC
  Filled 2015-02-28: qty 1

## 2015-02-28 MED ORDER — PROPOFOL 10 MG/ML IV BOLUS
INTRAVENOUS | Status: AC
  Filled 2015-02-28: qty 20

## 2015-02-28 MED ORDER — CEFAZOLIN SODIUM-DEXTROSE 2-3 GM-% IV SOLR
2.0000 g | INTRAVENOUS | Status: AC
  Administered 2015-02-28: 2 g via INTRAVENOUS

## 2015-02-28 MED ORDER — FLEET ENEMA 7-19 GM/118ML RE ENEM: 1.0000 | ENEMA | Freq: Once | RECTAL | Status: AC | PRN

## 2015-02-28 MED ORDER — ACETAMINOPHEN 10 MG/ML IV SOLN
INTRAVENOUS | Status: AC
  Filled 2015-02-28: qty 100

## 2015-02-28 MED ORDER — POLYETHYLENE GLYCOL 3350 17 G PO PACK
17.0000 g | PACK | Freq: Two times a day (BID) | ORAL | Status: DC
  Administered 2015-02-28 – 2015-03-02 (×4): 17 g via ORAL

## 2015-02-28 MED ORDER — PROPOFOL INFUSION 10 MG/ML OPTIME
INTRAVENOUS | Status: DC | PRN
  Administered 2015-02-28: 50 ug/kg/min via INTRAVENOUS

## 2015-02-28 MED ORDER — ACETAMINOPHEN 10 MG/ML IV SOLN
1000.0000 mg | Freq: Once | INTRAVENOUS | Status: AC
  Administered 2015-02-28: 1000 mg via INTRAVENOUS

## 2015-02-28 MED ORDER — ONDANSETRON HCL 4 MG PO TABS: 4.0000 mg | ORAL_TABLET | Freq: Four times a day (QID) | ORAL | Status: DC | PRN

## 2015-02-28 MED ORDER — SODIUM CHLORIDE 0.9 % IR SOLN
Status: DC | PRN
  Administered 2015-02-28: 1000 mL

## 2015-02-28 MED ORDER — MENTHOL 3 MG MT LOZG: 1.0000 | LOZENGE | OROMUCOSAL | Status: DC | PRN

## 2015-02-28 MED ORDER — OXYCODONE HCL 5 MG PO TABS
5.0000 mg | ORAL_TABLET | ORAL | Status: DC | PRN
  Administered 2015-02-28 – 2015-03-02 (×10): 10 mg via ORAL
  Filled 2015-02-28 (×10): qty 2

## 2015-02-28 MED ORDER — MORPHINE SULFATE 2 MG/ML IJ SOLN
1.0000 mg | INTRAMUSCULAR | Status: DC | PRN
  Administered 2015-02-28 – 2015-03-01 (×4): 2 mg via INTRAVENOUS
  Filled 2015-02-28 (×4): qty 1

## 2015-02-28 MED ORDER — SODIUM CHLORIDE 0.9 % IV SOLN
INTRAVENOUS | Status: DC
  Administered 2015-02-28: 75 mL/h via INTRAVENOUS

## 2015-02-28 MED ORDER — METOCLOPRAMIDE HCL 10 MG PO TABS: 5.0000 mg | ORAL_TABLET | Freq: Three times a day (TID) | ORAL | Status: DC | PRN

## 2015-02-28 MED ORDER — TRAMADOL HCL 50 MG PO TABS
50.0000 mg | ORAL_TABLET | Freq: Four times a day (QID) | ORAL | Status: DC | PRN
  Administered 2015-02-28 – 2015-03-01 (×2): 100 mg via ORAL
  Filled 2015-02-28 (×2): qty 2

## 2015-02-28 MED ORDER — SODIUM CHLORIDE 0.9 % IJ SOLN
INTRAMUSCULAR | Status: DC | PRN
  Administered 2015-02-28: 30 mL

## 2015-02-28 MED ORDER — ACETAMINOPHEN 500 MG PO TABS
1000.0000 mg | ORAL_TABLET | Freq: Four times a day (QID) | ORAL | Status: AC
  Administered 2015-02-28 – 2015-03-01 (×4): 1000 mg via ORAL
  Filled 2015-02-28 (×4): qty 2

## 2015-02-28 MED ORDER — RIVAROXABAN 10 MG PO TABS
10.0000 mg | ORAL_TABLET | Freq: Every day | ORAL | Status: DC
  Administered 2015-03-01 – 2015-03-02 (×2): 10 mg via ORAL
  Filled 2015-02-28 (×3): qty 1

## 2015-02-28 MED ORDER — BISACODYL 10 MG RE SUPP: 10.0000 mg | Freq: Every day | RECTAL | Status: DC | PRN

## 2015-02-28 MED ORDER — METHOCARBAMOL 1000 MG/10ML IJ SOLN
500.0000 mg | Freq: Four times a day (QID) | INTRAVENOUS | Status: DC | PRN
  Administered 2015-02-28: 500 mg via INTRAVENOUS
  Filled 2015-02-28 (×2): qty 5

## 2015-02-28 MED ORDER — 0.9 % SODIUM CHLORIDE (POUR BTL) OPTIME
TOPICAL | Status: DC | PRN
  Administered 2015-02-28: 1000 mL

## 2015-02-28 MED ORDER — SODIUM CHLORIDE 0.9 % IV SOLN: INTRAVENOUS | Status: DC

## 2015-02-28 MED ORDER — EPHEDRINE SULFATE 50 MG/ML IJ SOLN
INTRAMUSCULAR | Status: DC | PRN
  Administered 2015-02-28 (×3): 5 mg via INTRAVENOUS

## 2015-02-28 MED ORDER — DEXAMETHASONE SODIUM PHOSPHATE 10 MG/ML IJ SOLN
10.0000 mg | Freq: Once | INTRAMUSCULAR | Status: AC
  Administered 2015-03-01: 10 mg via INTRAVENOUS
  Filled 2015-02-28: qty 1

## 2015-02-28 MED ORDER — BUPIVACAINE-EPINEPHRINE (PF) 0.25% -1:200000 IJ SOLN
INTRAMUSCULAR | Status: AC
  Filled 2015-02-28: qty 30

## 2015-02-28 MED ORDER — FENTANYL CITRATE (PF) 100 MCG/2ML IJ SOLN
INTRAMUSCULAR | Status: AC
  Filled 2015-02-28: qty 2

## 2015-02-28 MED ORDER — LACTATED RINGERS IV SOLN: INTRAVENOUS | Status: DC | PRN

## 2015-02-28 MED ORDER — METHOCARBAMOL 500 MG PO TABS
500.0000 mg | ORAL_TABLET | Freq: Four times a day (QID) | ORAL | Status: DC | PRN
  Administered 2015-02-28 – 2015-03-02 (×4): 500 mg via ORAL
  Filled 2015-02-28 (×4): qty 1

## 2015-02-28 MED ORDER — LEVOTHYROXINE SODIUM 50 MCG PO TABS
50.0000 ug | ORAL_TABLET | Freq: Every day | ORAL | Status: DC
  Administered 2015-03-01 – 2015-03-02 (×2): 50 ug via ORAL
  Filled 2015-02-28 (×3): qty 1

## 2015-02-28 MED ORDER — TRANEXAMIC ACID 1000 MG/10ML IV SOLN
1000.0000 mg | INTRAVENOUS | Status: AC
  Administered 2015-02-28: 1000 mg via INTRAVENOUS
  Filled 2015-02-28: qty 10

## 2015-02-28 MED ORDER — LACTATED RINGERS IV SOLN
INTRAVENOUS | Status: DC
  Administered 2015-02-28: 15:00:00 via INTRAVENOUS

## 2015-02-28 MED ORDER — EPHEDRINE SULFATE 50 MG/ML IJ SOLN
INTRAMUSCULAR | Status: AC
  Filled 2015-02-28: qty 1

## 2015-02-28 MED ORDER — LIDOCAINE HCL (CARDIAC) 20 MG/ML IV SOLN
INTRAVENOUS | Status: DC | PRN
  Administered 2015-02-28: 100 mg via INTRAVENOUS

## 2015-02-28 MED ORDER — BUPIVACAINE LIPOSOME 1.3 % IJ SUSP
INTRAMUSCULAR | Status: DC | PRN
  Administered 2015-02-28: 20 mL

## 2015-02-28 SURGICAL SUPPLY — 65 items
BAG DECANTER FOR FLEXI CONT (MISCELLANEOUS) ×3 IMPLANT
BAG SPEC THK2 15X12 ZIP CLS (MISCELLANEOUS) ×1
BAG ZIPLOCK 12X15 (MISCELLANEOUS) ×3 IMPLANT
BANDAGE ELASTIC 6 VELCRO ST LF (GAUZE/BANDAGES/DRESSINGS) ×3 IMPLANT
BANDAGE ESMARK 6X9 LF (GAUZE/BANDAGES/DRESSINGS) ×1 IMPLANT
BLADE SAG 18X100X1.27 (BLADE) ×3 IMPLANT
BLADE SAW SGTL 11.0X1.19X90.0M (BLADE) ×3 IMPLANT
BNDG CMPR 9X6 STRL LF SNTH (GAUZE/BANDAGES/DRESSINGS) ×1
BNDG ESMARK 6X9 LF (GAUZE/BANDAGES/DRESSINGS) ×3
BOWL SMART MIX CTS (DISPOSABLE) ×3 IMPLANT
CAP KNEE TOTAL 3 SIGMA ×2 IMPLANT
CEMENT HV SMART SET (Cement) ×4 IMPLANT
CLOSURE WOUND 1/2 X4 (GAUZE/BANDAGES/DRESSINGS) ×1
CUFF TOURN SGL QUICK 34 (TOURNIQUET CUFF) ×3
CUFF TRNQT CYL 34X4X40X1 (TOURNIQUET CUFF) ×1 IMPLANT
DECANTER SPIKE VIAL GLASS SM (MISCELLANEOUS) ×3 IMPLANT
DRAPE EXTREMITY T 121X128X90 (DRAPE) ×3 IMPLANT
DRAPE POUCH INSTRU U-SHP 10X18 (DRAPES) ×3 IMPLANT
DRAPE U-SHAPE 47X51 STRL (DRAPES) ×3 IMPLANT
DRSG ADAPTIC 3X8 NADH LF (GAUZE/BANDAGES/DRESSINGS) ×3 IMPLANT
DRSG PAD ABDOMINAL 8X10 ST (GAUZE/BANDAGES/DRESSINGS) ×3 IMPLANT
DURAPREP 26ML APPLICATOR (WOUND CARE) ×3 IMPLANT
ELECT REM PT RETURN 9FT ADLT (ELECTROSURGICAL) ×3
ELECTRODE REM PT RTRN 9FT ADLT (ELECTROSURGICAL) ×1 IMPLANT
EVACUATOR 1/8 PVC DRAIN (DRAIN) ×3 IMPLANT
FACESHIELD WRAPAROUND (MASK) ×15 IMPLANT
FACESHIELD WRAPAROUND OR TEAM (MASK) ×5 IMPLANT
GAUZE SPONGE 4X4 12PLY STRL (GAUZE/BANDAGES/DRESSINGS) ×3 IMPLANT
GLOVE BIO SURGEON STRL SZ7.5 (GLOVE) IMPLANT
GLOVE BIO SURGEON STRL SZ8 (GLOVE) ×3 IMPLANT
GLOVE BIOGEL PI IND STRL 6.5 (GLOVE) IMPLANT
GLOVE BIOGEL PI IND STRL 8 (GLOVE) ×1 IMPLANT
GLOVE BIOGEL PI INDICATOR 6.5 (GLOVE)
GLOVE BIOGEL PI INDICATOR 8 (GLOVE) ×2
GLOVE SURG SS PI 6.5 STRL IVOR (GLOVE) IMPLANT
GOWN STRL REUS W/TWL LRG LVL3 (GOWN DISPOSABLE) ×3 IMPLANT
GOWN STRL REUS W/TWL XL LVL3 (GOWN DISPOSABLE) IMPLANT
HANDPIECE INTERPULSE COAX TIP (DISPOSABLE) ×3
IMMOBILIZER KNEE 20 (SOFTGOODS) ×3
IMMOBILIZER KNEE 20 THIGH 36 (SOFTGOODS) ×1 IMPLANT
KIT BASIN OR (CUSTOM PROCEDURE TRAY) ×3 IMPLANT
MANIFOLD NEPTUNE II (INSTRUMENTS) ×3 IMPLANT
NDL SAFETY ECLIPSE 18X1.5 (NEEDLE) ×2 IMPLANT
NEEDLE HYPO 18GX1.5 SHARP (NEEDLE) ×6
NS IRRIG 1000ML POUR BTL (IV SOLUTION) ×3 IMPLANT
PACK TOTAL JOINT (CUSTOM PROCEDURE TRAY) ×3 IMPLANT
PAD ABD 8X10 STRL (GAUZE/BANDAGES/DRESSINGS) ×2 IMPLANT
PADDING CAST COTTON 6X4 STRL (CAST SUPPLIES) ×5 IMPLANT
PEN SKIN MARKING BROAD (MISCELLANEOUS) ×3 IMPLANT
POSITIONER SURGICAL ARM (MISCELLANEOUS) ×3 IMPLANT
SET HNDPC FAN SPRY TIP SCT (DISPOSABLE) ×1 IMPLANT
STRIP CLOSURE SKIN 1/2X4 (GAUZE/BANDAGES/DRESSINGS) ×3 IMPLANT
SUCTION FRAZIER 12FR DISP (SUCTIONS) ×3 IMPLANT
SUT MNCRL AB 4-0 PS2 18 (SUTURE) ×3 IMPLANT
SUT VIC AB 2-0 CT1 27 (SUTURE) ×9
SUT VIC AB 2-0 CT1 TAPERPNT 27 (SUTURE) ×3 IMPLANT
SUT VLOC 180 0 24IN GS25 (SUTURE) ×3 IMPLANT
SYR 20CC LL (SYRINGE) ×3 IMPLANT
SYR 50ML LL SCALE MARK (SYRINGE) ×3 IMPLANT
TOWEL OR 17X26 10 PK STRL BLUE (TOWEL DISPOSABLE) ×3 IMPLANT
TOWEL OR NON WOVEN STRL DISP B (DISPOSABLE) IMPLANT
TRAY FOLEY W/METER SILVER 14FR (SET/KITS/TRAYS/PACK) ×3 IMPLANT
WATER STERILE IRR 1500ML POUR (IV SOLUTION) ×3 IMPLANT
WRAP KNEE MAXI GEL POST OP (GAUZE/BANDAGES/DRESSINGS) ×3 IMPLANT
YANKAUER SUCT BULB TIP 10FT TU (MISCELLANEOUS) ×3 IMPLANT

## 2015-02-28 NOTE — Op Note (Signed)
Pre-operative diagnosis- Osteoarthritis  Left knee(s)  Post-operative diagnosis- Osteoarthritis Left knee(s)  Procedure-  Left  Total Knee Arthroplasty  Surgeon- Dione Plover. Day Greb, MD  Assistant- Arlee Muslim, PA-C   Anesthesia-  Spinal  EBL-* No blood loss amount entered *   Drains Hemovac  Tourniquet time-  Total Tourniquet Time Documented: Thigh (Left) - 35 minutes Total: Thigh (Left) - 35 minutes     Complications- None  Condition-PACU - hemodynamically stable.   Brief Clinical Note  Robyn Lambert is a 74 y.o. year old female with end stage OA of her left knee with progressively worsening pain and dysfunction. She has constant pain, with activity and at rest and significant functional deficits with difficulties even with ADLs. She has had extensive non-op management including analgesics, injections of cortisone and viscosupplements, and home exercise program, but remains in significant pain with significant dysfunction. Radiographs show bone on bone arthritis medial and patellofemoral. She presents now for left Total Knee Arthroplasty.    Procedure in detail---   The patient is brought into the operating room and positioned supine on the operating table. After successful administration of  Spinal,   a tourniquet is placed high on the  Left thigh(s) and the lower extremity is prepped and draped in the usual sterile fashion. Time out is performed by the operating team and then the  Left lower extremity is wrapped in E[place], knee flexed and the tourniquet inflated to 300 mmHg.       A midline incision is made with a ten blade through the subcutaneous tissue to the level of the extensor mechanism. A fresh blade is used to make a medial parapatellar arthrotomy. Soft tissue over the proximal medial tibia is subperiosteally elevated to the joint line with a knife and into the semimembranosus bursa with a Cobb elevator. Soft tissue over the proximal lateral tibia is elevated with attention  being paid to avoiding the patellar tendon on the tibial tubercle. The patella is everted, knee flexed 90 degrees and the ACL and PCL are removed. Findings are bone on bone medial and patellofemoral with large global osteophytes.        The drill is used to create a starting hole in the distal femur and the canal is thoroughly irrigated with sterile saline to remove the fatty contents. The 5 degree Left  valgus alignment guide is placed into the femoral canal and the distal femoral cutting block is pinned to remove 10 mm off the distal femur. Resection is made with an oscillating saw.      The tibia is subluxed forward and the menisci are removed. The extramedullary alignment guide is placed referencing proximally at the medial aspect of the tibial tubercle and distally along the second metatarsal axis and tibial crest. The block is pinned to remove 3mm off the more deficient medial  side. Resection is made with an oscillating saw. Size 3is the most appropriate size for the tibia and the proximal tibia is prepared with the modular drill and keel punch for that size.      The femoral sizing guide is placed and size 4 is most appropriate. Rotation is marked off the epicondylar axis and confirmed by creating a rectangular flexion gap at 90 degrees. The size 4 cutting block is pinned in this rotation and the anterior, posterior and chamfer cuts are made with the oscillating saw. The intercondylar block is then placed and that cut is made.      Trial size 3 tibial component, trial  size 4 narrow posterior stabilized femur and a 10  mm posterior stabilized rotating platform insert trial is placed. Full extension is achieved with excellent varus/valgus and anterior/posterior balance throughout full range of motion. The patella is everted and thickness measured to be 22  mm. Free hand resection is taken to 12 mm, a 38 template is placed, lug holes are drilled, trial patella is placed, and it tracks normally. Osteophytes  are removed off the posterior femur with the trial in place. All trials are removed and the cut bone surfaces prepared with pulsatile lavage. Cement is mixed and once ready for implantation, the size 3 tibial implant, size  4 narrow posterior stabilized femoral component, and the size 38 patella are cemented in place and the patella is held with the clamp. The trial insert is placed and the knee held in full extension. The Exparel (20 ml mixed with 30 ml saline) and .25% Bupivicaine, are injected into the extensor mechanism, posterior capsule, medial and lateral gutters and subcutaneous tissues.  All extruded cement is removed and once the cement is hard the permanent 10 mm posterior stabilized rotating platform insert is placed into the tibial tray.      The wound is copiously irrigated with saline solution and the extensor mechanism closed over a hemovac drain with #1 V-loc suture. The tourniquet is released for a total tourniquet time of 35  minutes. Flexion against gravity is 140 degrees and the patella tracks normally. Subcutaneous tissue is closed with 2.0 vicryl and subcuticular with running 4.0 Monocryl. The incision is cleaned and dried and steri-strips and a bulky sterile dressing are applied. The limb is placed into a knee immobilizer and the patient is awakened and transported to recovery in stable condition.      Please note that a surgical assistant was a medical necessity for this procedure in order to perform it in a safe and expeditious manner. Surgical assistant was necessary to retract the ligaments and vital neurovascular structures to prevent injury to them and also necessary for proper positioning of the limb to allow for anatomic placement of the prosthesis.   Dione Plover Zaidan Keeble, MD    02/28/2015, 1:31 PM

## 2015-02-28 NOTE — Transfer of Care (Signed)
Immediate Anesthesia Transfer of Care Note  Patient: Robyn Lambert  Procedure(s) Performed: Procedure(s): LEFT TOTAL KNEE ARTHROPLASTY (Left)  Patient Location: PACU  Anesthesia Type:MAC and Spinal  Level of Consciousness: awake, alert , oriented and patient cooperative  Airway & Oxygen Therapy: Patient Spontanous Breathing and Patient connected to face mask oxygen  Post-op Assessment: Report given to RN and Post -op Vital signs reviewed and stable  Post vital signs: Reviewed and stable  Last Vitals:  Filed Vitals:   02/28/15 1050  BP: 144/80  Pulse: 80  Temp: 36.7 C  Resp: 18    Complications: No apparent anesthesia complications

## 2015-02-28 NOTE — Anesthesia Preprocedure Evaluation (Addendum)
Anesthesia Evaluation  Patient identified by MRN, date of birth, ID band Patient awake    Reviewed: Allergy & Precautions, H&P , NPO status , Patient's Chart, lab work & pertinent test results  Airway Mallampati: II  TM Distance: >3 FB Neck ROM: full    Dental  (+) Dental Advisory Given, Chipped Left upper front chipped:   Pulmonary neg pulmonary ROS,  breath sounds clear to auscultation  Pulmonary exam normal       Cardiovascular Exercise Tolerance: Good negative cardio ROS Normal cardiovascular examRhythm:regular Rate:Normal     Neuro/Psych negative neurological ROS  negative psych ROS   GI/Hepatic negative GI ROS, Neg liver ROS,   Endo/Other  negative endocrine ROSHypothyroidism   Renal/GU negative Renal ROS  negative genitourinary   Musculoskeletal   Abdominal   Peds  Hematology negative hematology ROS (+)   Anesthesia Other Findings   Reproductive/Obstetrics negative OB ROS                             Anesthesia Physical Anesthesia Plan  ASA: II  Anesthesia Plan: Spinal   Post-op Pain Management:    Induction:   Airway Management Planned: Simple Face Mask  Additional Equipment:   Intra-op Plan:   Post-operative Plan:   Informed Consent: I have reviewed the patients History and Physical, chart, labs and discussed the procedure including the risks, benefits and alternatives for the proposed anesthesia with the patient or authorized representative who has indicated his/her understanding and acceptance.   Dental Advisory Given  Plan Discussed with: CRNA and Surgeon  Anesthesia Plan Comments:         Anesthesia Quick Evaluation

## 2015-02-28 NOTE — Anesthesia Procedure Notes (Addendum)
Spinal  Start time: 02/28/2015 12:19 PM End time: 02/28/2015 12:26 PM Staffing Resident/CRNA: Edilia Ghuman A Preanesthetic Checklist Completed: patient identified, site marked, surgical consent, pre-op evaluation, timeout performed, IV checked, risks and benefits discussed and monitors and equipment checked Spinal Block Patient position: sitting Prep: Betasept Patient monitoring: heart rate, continuous pulse ox and blood pressure Approach: midline Location: L2-3 Injection technique: single-shot Needle Needle type: Spinocan  Needle gauge: 22 G Needle length: 9 cm Assessment Sensory level: T4 Additional Notes Pt tolerated well. Placed in sitting position. One attempt by CRNA. + CSF, -heme. Spinal kit expiration date 2017-12  Procedure Name: MAC Date/Time: 02/28/2015 12:14 PM Performed by: Carleene Cooper A Pre-anesthesia Checklist: Patient identified, Timeout performed, Emergency Drugs available, Suction available and Patient being monitored Patient Re-evaluated:Patient Re-evaluated prior to inductionOxygen Delivery Method: Simple face mask Dental Injury: Teeth and Oropharynx as per pre-operative assessment

## 2015-02-28 NOTE — Progress Notes (Signed)
Utilization review completed.  

## 2015-02-28 NOTE — Anesthesia Postprocedure Evaluation (Signed)
  Anesthesia Post-op Note  Patient: Robyn Lambert  Procedure(s) Performed: Procedure(s) (LRB): LEFT TOTAL KNEE ARTHROPLASTY (Left)  Patient Location: PACU  Anesthesia Type: Spinal  Level of Consciousness: awake and alert   Airway and Oxygen Therapy: Patient Spontanous Breathing  Post-op Pain: mild  Post-op Assessment: Post-op Vital signs reviewed, Patient's Cardiovascular Status Stable, Respiratory Function Stable, Patent Airway and No signs of Nausea or vomiting  Last Vitals:  Filed Vitals:   02/28/15 1500  BP: 128/61  Pulse: 75  Temp: 36.3 C  Resp: 17    Post-op Vital Signs: stable   Complications: No apparent anesthesia complications

## 2015-02-28 NOTE — Interval H&P Note (Signed)
History and Physical Interval Note:  02/28/2015 12:12 PM  Robyn Lambert  has presented today for surgery, with the diagnosis of OA LEFT KNEE   The various methods of treatment have been discussed with the patient and family. After consideration of risks, benefits and other options for treatment, the patient has consented to  Procedure(s): LEFT TOTAL KNEE ARTHROPLASTY (Left) as a surgical intervention .  The patient's history has been reviewed, patient examined, no change in status, stable for surgery.  I have reviewed the patient's chart and labs.  Questions were answered to the patient's satisfaction.     [name]

## 2015-03-01 ENCOUNTER — Encounter (HOSPITAL_COMMUNITY): Payer: Self-pay | Admitting: Orthopedic Surgery

## 2015-03-01 LAB — CBC
HCT: 33.4 % — ABNORMAL LOW (ref 36.0–46.0)
HEMOGLOBIN: 10.9 g/dL — AB (ref 12.0–15.0)
MCH: 28.4 pg (ref 26.0–34.0)
MCHC: 32.6 g/dL (ref 30.0–36.0)
MCV: 87 fL (ref 78.0–100.0)
Platelets: 177 10*3/uL (ref 150–400)
RBC: 3.84 MIL/uL — ABNORMAL LOW (ref 3.87–5.11)
RDW: 13.3 % (ref 11.5–15.5)
WBC: 8.8 10*3/uL (ref 4.0–10.5)

## 2015-03-01 LAB — BASIC METABOLIC PANEL
Anion gap: 7 (ref 5–15)
BUN: 10 mg/dL (ref 6–20)
CO2: 27 mmol/L (ref 22–32)
Calcium: 8.9 mg/dL (ref 8.9–10.3)
Chloride: 104 mmol/L (ref 101–111)
Creatinine, Ser: 0.58 mg/dL (ref 0.44–1.00)
GFR calc Af Amer: >60 mL/min (ref >60)
GFR calc non Af Amer: >60 mL/min (ref >60)
GLUCOSE: 145 mg/dL — AB (ref 65–99)
POTASSIUM: 3.9 mmol/L (ref 3.5–5.1)
Sodium: 138 mmol/L (ref 135–145)

## 2015-03-01 NOTE — Clinical Social Work Note (Signed)
Clinical Social Work Assessment  Patient Details  Name: Robyn Lambert MRN: [id number] Date of Birth: 1941/03/01  Date of referral:  03/01/15               Reason for consult:  Discharge Planning                Permission sought to share information with:  Family Supports Permission granted to share information::  Yes, Verbal Permission Granted  Name::     Robyn Lambert  Agency::     Relationship::  daughter  Contact Information:  [phone]  Housing/Transportation Living arrangements for the past 2 months:  [name] of Information:  Patient, Adult Children Patient Interpreter Needed:  None Criminal Activity/Legal Involvement Pertinent to Current Situation/Hospitalization:  No - Comment as needed Significant Relationships:  Adult Children Lives with:  Self Do you feel safe going back to the place where you live?  No Need for family participation in patient care:  No (Coment) (pt at bedside )  Care giving concerns:  Pt from home alone. Pt had left total knee replacement and needing short term rehab.   Social Worker assessment / plan:  CSW received referral for New SNF.  CSW met with pt and multiple family members at bedside. CSW introduced self and explained role. Pt stated that she is feeling well post surgery. CSW discussed recommendation for rehab at [facility] following hospitalization. Pt expressed understanding and stated that she pre-registered at [facility] in [place] for her rehab needs. Pt family aware and agreeable to plan. Pt states that she would like for pt family to transport and CSW encouraged pt to discussed with PT to ensure safe to travel via private vehicle.   CSW completed FL2 and sent pt clinicals to [facility]. CSW spoke to [facility] who confirmed that pt pre-registered at facility and [facility] can accept pt upon discharge.  CSW to continue to follow to provide support and assist with pt discharge to [place] when medically ready  for discharge.  Employment status:  Retired Forensic scientist:  Medicare PT Recommendations:  [place] / Referral to community resources:  [place]  Patient/Family's Response to care:  Pt alert and oriented x 4. Pt pleasant and actively involved in conversation. Pt has supportive family and pt and family coping appropriately with pt current needs.  Patient/Family's Understanding of and Emotional Response to Diagnosis, Current Treatment, and Prognosis:  Pt displayed understanding about post surgery recommendations and is eager to go to [facility] in order to get stronger and return home.   Emotional Assessment Appearance:  Appears stated age Attitude/Demeanor/Rapport:    Affect (typically observed):  Other (pt appropriate) Orientation:  Oriented to Self, Oriented to Place, Oriented to  Time, Oriented to Situation Alcohol / Substance use:  Not Applicable Psych involvement (Current and /or in the community):  No (Comment)  Discharge Needs  Concerns to be addressed:  Discharge Planning Concerns Readmission within the last 30 days:  No Current discharge risk:  None Barriers to Discharge:  No Barriers Identified   [place], [place]i, LCSW 03/01/2015, 1:47 PM  [phone]

## 2015-03-01 NOTE — Evaluation (Signed)
Physical Therapy Evaluation Patient Details Name: Robyn Lambert MRN: [id number] DOB: February 03, 1941 Today's Date: 03/01/2015   History of Present Illness  L TKR  Clinical Impression  Pt s/p L TKR presents with decreased L LE strength/ROM and post op pain limiting functional mobility.  Pt would benefit from follow up rehab at SNF level to maximize IND and safety prior to return home alone.    Follow Up Recommendations SNF    Equipment Recommendations  None recommended by PT    Recommendations for Other Services OT consult     Precautions / Restrictions Precautions Precautions: Knee;Fall Required Braces or Orthoses: Knee Immobilizer - Left Knee Immobilizer - Left: Discontinue once straight leg raise with < 10 degree lag Restrictions Weight Bearing Restrictions: No Other Position/Activity Restrictions: WBAT      Mobility  Bed Mobility Overal bed mobility: Needs Assistance Bed Mobility: Supine to Sit     Supine to sit: Mod assist     General bed mobility comments: cues for sequence and use of R LE to self assist  Transfers Overall transfer level: Needs assistance Equipment used: Rolling walker (2 wheeled) Transfers: Sit to/from Stand Sit to Stand: Mod assist         General transfer comment: cues for LE management and use of UEs to self assist  Ambulation/Gait Ambulation/Gait assistance: Min assist;Mod assist Ambulation Distance (Feet): 15 Feet Assistive device: Rolling walker (2 wheeled) Gait Pattern/deviations: Step-to pattern;Decreased step length - left;Decreased step length - right;Shuffle;Trunk flexed Gait velocity: decr   General Gait Details: cues for sequence, posture and position from [employer]            Wheelchair Mobility    Modified Rankin (Stroke Patients Only)       Balance                                             Pertinent Vitals/Pain Pain Assessment: 0-10 Pain Score: 4  Pain Location: L knee/thigh Pain  Descriptors / Indicators: Aching;Sore Pain Intervention(s): Limited activity within patient's tolerance;Monitored during session;Premedicated before session;Ice applied    Home Living Family/patient expects to be discharged to:: Skilled nursing facility Living Arrangements: Alone                    Prior Function Level of Independence: Independent               Hand Dominance   Dominant Hand: Right    Extremity/Trunk Assessment   Upper Extremity Assessment: Overall WFL for tasks assessed           Lower Extremity Assessment: LLE deficits/detail   LLE Deficits / Details: 2/5 quads with AAROM at knee -10- 60  Cervical / Trunk Assessment: Normal  Communication   Communication: No difficulties  Cognition Arousal/Alertness: Awake/alert Behavior During Therapy: WFL for tasks assessed/performed Overall Cognitive Status: Within Functional Limits for tasks assessed                      General Comments      Exercises Total Joint Exercises Ankle Circles/Pumps: AROM;Both;15 reps;Supine Quad Sets: AROM;Both;10 reps;Supine Heel Slides: AAROM;Left;15 reps;Supine Hip ABduction/ADduction: AAROM;Left;10 reps;Supine      Assessment/Plan    PT Assessment Patient needs continued PT services  PT Diagnosis Difficulty walking   PT Problem List Decreased range of motion;Decreased strength;Decreased activity tolerance;Decreased mobility;Decreased knowledge of  use of DME;Pain;Decreased knowledge of precautions  PT Treatment Interventions DME instruction;Gait training;Functional mobility training;Therapeutic activities;Therapeutic exercise;Patient/family education   PT Goals (Current goals can be found in the Care Plan section) Acute Rehab PT Goals Patient Stated Goal: Rehab at [facility] and home to resume previous lifestyle PT Goal Formulation: With patient Time For Goal Achievement: 03/06/15 Potential to Achieve Goals: Good    Frequency 7X/week   Barriers to  discharge        Co-evaluation               End of Session Equipment Utilized During Treatment: Gait belt;Left knee immobilizer Activity Tolerance: Patient tolerated treatment well;Other (comment) (Ltd by c/o mild dizziness - BP 122/69) Patient left: in chair Nurse Communication: Mobility status         Time: 5176-1607 PT Time Calculation (min) (ACUTE ONLY): 32 min   Charges:   PT Evaluation $Initial PT Evaluation Tier I: 1 Procedure PT Treatments $Therapeutic Exercise: 8-22 mins   PT G Codes:        Robyn Lambert Mar 21, 2015, 12:15 PM

## 2015-03-01 NOTE — Progress Notes (Signed)
OT Cancellation Note  Patient Details Name: Robyn Lambert MRN: [id number] DOB: 24-Dec-1940   Cancelled Treatment:    Reason Eval/Treat Not Completed: Other (comment). Pt is in CPM; will check back tomorrow.  Haliyah Fryman 03/01/2015, 3:58 PM  Lesle Chris, OTR/L [phone] 03/01/2015

## 2015-03-01 NOTE — Progress Notes (Signed)
   Subjective: 1 Day Post-Op Procedure(s) (LRB): LEFT TOTAL KNEE ARTHROPLASTY (Left) Patient reports pain as moderate.   Patient seen in rounds by Dr. Wynelle Link.  She had a very rough night the night of surgery but doing better this morning. Patient is having problems with pain in the knee, requiring pain medications We will start therapy today.  Plan is to go Skilled nursing facility after hospital stay.  Objective: Vital signs in last 24 hours: Temp:  [97.2 F (36.2 C)-98.2 F (36.8 C)] 98.1 F (36.7 C) (07/21 0558) Pulse Rate:  [71-85] 78 (07/21 0558) Resp:  [11-18] 14 (07/21 0558) BP: (109-150)/(52-86) 115/56 mmHg (07/21 0558) SpO2:  [99 %-100 %] 99 % (07/21 0558) Weight:  [88.451 kg (195 lb)] 88.451 kg (195 lb) (07/20 1500)  Intake/Output from previous day:  Intake/Output Summary (Last 24 hours) at 03/01/15 0810 Last data filed at 03/01/15 0559  Gross per 24 hour  Intake 3117.5 ml  Output   3730 ml  Net -612.5 ml    Intake/Output this shift: UOP 1000 since MN  Labs:  Recent Labs  03/01/15 0415  HGB 10.9*    Recent Labs  03/01/15 0415  WBC 8.8  RBC 3.84*  HCT 33.4*  PLT 177    Recent Labs  03/01/15 0415  NA 138  K 3.9  CL 104  CO2 27  BUN 10  CREATININE 0.58  GLUCOSE 145*  CALCIUM 8.9   No results for input(s): LABPT, INR in the last 72 hours.  EXAM General - Patient is Alert and Appropriate Extremity - Neurovascular intact Sensation intact distally Dressing - dressing C/D/I Motor Function - intact, moving foot and toes well on exam.  Hemovac pulled without difficulty.  Past Medical History  Diagnosis Date  . Hyperlipidemia   . Arthritis   . GERD (gastroesophageal reflux disease)   . Rectocele   . Anxiety   . Joint pain     MULTIPLE ARTHRITIC JOINTS  . Thyroid disease     Assessment/Plan: 1 Day Post-Op Procedure(s) (LRB): LEFT TOTAL KNEE ARTHROPLASTY (Left) Principal Problem:   OA (osteoarthritis) of knee  Estimated body  mass index is 32.45 kg/(m^2) as calculated from the following:   Height as of this encounter: 5\' 5"  (1.651 m).   Weight as of this encounter: 88.451 kg (195 lb). Advance diet Up with therapy Plan for discharge tomorrow Discharge to SNF - wants to look into [facility] facility  DVT Prophylaxis - Xarelto Weight-Bearing as tolerated to left leg D/C O2 and Pulse OX and try on Room Air  Arlee Muslim, PA-C Orthopaedic Surgery 03/01/2015, 8:10 AM

## 2015-03-01 NOTE — Care Management Note (Signed)
Case Management Note  Patient Details  Name: Bethanie Bloxom MRN: [id number] Date of Birth: 02-09-1941  Subjective/Objective:                   LEFT TOTAL KNEE ARTHROPLASTY (Left) Action/Plan:  Discharge planning Expected Discharge Date:  03/02/15              Expected Discharge Plan:  [place]  In-House Referral:     Discharge planning Services  CM Consult  Post Acute Care Choice:    Choice offered to:     DME Arranged:    DME Agency:     HH Arranged:    [place] Agency:     Status of Service:  Completed, signed off  Medicare Important Message Given:    Date Medicare IM Given:    Medicare IM give by:    Date Additional Medicare IM Given:    Additional Medicare Important Message give by:     If discussed at [place] of Stay Meetings, dates discussed:    Additional Comments: CM notes pt to go to SNF; CSW consult placed by MD; RN aware.  No other CM needs were communicated. Dellie Catholic, RN 03/01/2015, 12:57 PM

## 2015-03-01 NOTE — Progress Notes (Signed)
Physical Therapy Treatment Patient Details Name: Patsy Zaragoza MRN: [id number] DOB: [birth year] Today's Date: 03/16/15    History of Present Illness L TKR    PT Comments    Pt progressing with mobility with decreased c/o dizziness this pm.  Follow Up Recommendations  SNF     Equipment Recommendations  None recommended by PT    Recommendations for Other Services OT consult     Precautions / Restrictions Precautions Precautions: Knee;Fall Required Braces or Orthoses: Knee Immobilizer - Left Knee Immobilizer - Left: Discontinue once straight leg raise with < 10 degree lag Restrictions Weight Bearing Restrictions: No Other Position/Activity Restrictions: WBAT    Mobility  Bed Mobility Overal bed mobility: Needs Assistance Bed Mobility: Sit to Supine       Sit to supine: Min assist   General bed mobility comments: cues for sequence and use of R LE to self assist  Transfers Overall transfer level: Needs assistance Equipment used: Rolling walker (2 wheeled) Transfers: Sit to/from Stand Sit to Stand: Min assist;Mod assist         General transfer comment: cues for LE management and use of UEs to self assist  Ambulation/Gait Ambulation/Gait assistance: Min assist Ambulation Distance (Feet): 58 Feet Assistive device: Rolling walker (2 wheeled) Gait Pattern/deviations: Step-to pattern;Decreased step length - right;Decreased step length - left;Shuffle;Trunk flexed Gait velocity: decr   General Gait Details: cues for sequence, posture and position from [name]            Wheelchair Mobility    Modified Rankin (Stroke Patients Only)       Balance                                    Cognition Arousal/Alertness: Awake/alert Behavior During Therapy: WFL for tasks assessed/performed Overall Cognitive Status: Within Functional Limits for tasks assessed                      Exercises      General Comments         Pertinent Vitals/Pain Pain Assessment: 0-10 Pain Score: 5  Pain Location: L knee/thigh Pain Descriptors / Indicators: Aching;Sore Pain Intervention(s): Limited activity within patient's tolerance;Monitored during session;Premedicated before session    Home Living                      Prior Function            PT Goals (current goals can now be found in the care plan section) Acute Rehab PT Goals Patient Stated Goal: Rehab at [facility] and home to resume previous lifestyle PT Goal Formulation: With patient Time For Goal Achievement: 03/06/15 Potential to Achieve Goals: Good Progress towards PT goals: Progressing toward goals    Frequency  7X/week    PT Plan Current plan remains appropriate    Co-evaluation             End of Session Equipment Utilized During Treatment: Gait belt;Left knee immobilizer Activity Tolerance: Patient tolerated treatment well Patient left: in bed;with call bell/phone within reach;with family/visitor present     Time: 5852-7782 PT Time Calculation (min) (ACUTE ONLY): 27 min  Charges:  $Gait Training: 23-37 mins                    G Codes:      Bobi Daudelin 16-Mar-2015, 3:23 PM

## 2015-03-01 NOTE — Discharge Instructions (Addendum)
° °Dr. Frank Aluisio °Total Joint Specialist °[facility] °[address] °[place], [place] [address] °[phone] ° °TOTAL KNEE REPLACEMENT POSTOPERATIVE DIRECTIONS ° °Knee Rehabilitation, Guidelines Following Surgery  °Results after knee surgery are often greatly improved when you follow the exercise, range of motion and muscle strengthening exercises prescribed by your doctor. Safety measures are also important to protect the knee from further injury. Any time any of these exercises cause you to have increased pain or swelling in your knee joint, decrease the amount until you are comfortable again and slowly increase them. If you have problems or questions, call your caregiver or physical therapist for advice.  ° °HOME CARE INSTRUCTIONS  °Remove items at home which could result in a fall. This includes throw rugs or furniture in walking pathways.  °· ICE to the affected knee every three hours for 30 minutes at a time and then as needed for pain and swelling.  Continue to use ice on the knee for pain and swelling from surgery. You may notice swelling that will progress down to the foot and ankle.  This is normal after surgery.  Elevate the leg when you are not up walking on it.   °· Continue to use the breathing machine which will help keep your temperature down.  It is common for your temperature to cycle up and down following surgery, especially at night when you are not up moving around and exerting yourself.  The breathing machine keeps your lungs expanded and your temperature down. °· Do not place pillow under knee, focus on keeping the knee straight while resting ° °DIET °You may resume your previous home diet once your are discharged from the hospital. ° °DRESSING / WOUND CARE / SHOWERING °You may shower 3 days after surgery, but keep the wounds dry during showering.  You may use an occlusive plastic wrap (Press'n Seal for example), NO SOAKING/SUBMERGING IN THE BATHTUB.  If the  bandage gets wet, change with a clean dry gauze.  If the incision gets wet, pat the wound dry with a clean towel. °You may start showering once you are discharged home but do not submerge the incision under water. Just pat the incision dry and apply a dry gauze dressing on daily. °Change the surgical dressing daily and reapply a dry dressing each time. ° °ACTIVITY °Walk with your walker as instructed. °Use walker as long as suggested by your caregivers. °Avoid periods of inactivity such as sitting longer than an hour when not asleep. This helps prevent blood clots.  °You may resume a sexual relationship in one month or when given the OK by your doctor.  °You may return to work once you are cleared by your doctor.  °Do not drive a car for 6 weeks or until released by you surgeon.  °Do not drive while taking narcotics. ° °WEIGHT BEARING °Weight bearing as tolerated with assist device (walker, cane, etc) as directed, use it as long as suggested by your surgeon or therapist, typically at least 4-6 weeks. ° °POSTOPERATIVE CONSTIPATION PROTOCOL °Constipation - defined medically as fewer than three stools per week and severe constipation as less than one stool per week. ° °One of the most common issues patients have following surgery is constipation.  Even if you have a regular bowel pattern at home, your normal regimen is likely to be disrupted due to multiple reasons following surgery.  Combination of anesthesia, postoperative narcotics, change in appetite and fluid intake all can affect your bowels.    In order to avoid complications following surgery, here are some recommendations in order to help you during your recovery period. ° °Colace (docusate) - Pick up an over-the-counter form of Colace or another stool softener and take twice a day as long as you are requiring postoperative pain medications.  Take with a full glass of water daily.  If you experience loose stools or diarrhea, hold the colace until you stool forms  back up.  If your symptoms do not get better within 1 week or if they get worse, check with your doctor. ° °Dulcolax (bisacodyl) - Pick up over-the-counter and take as directed by the product packaging as needed to assist with the movement of your bowels.  Take with a full glass of water.  Use this product as needed if not relieved by Colace only.  ° °MiraLax (polyethylene glycol) - Pick up over-the-counter to have on hand.  MiraLax is a solution that will increase the amount of water in your bowels to assist with bowel movements.  Take as directed and can mix with a glass of water, juice, soda, coffee, or tea.  Take if you go more than two days without a movement. °Do not use MiraLax more than once per day. Call your doctor if you are still constipated or irregular after using this medication for 7 days in a row. ° °If you continue to have problems with postoperative constipation, please contact the office for further assistance and recommendations.  If you experience "the worst abdominal pain ever" or develop nausea or vomiting, please contact the office immediatly for further recommendations for treatment. ° °ITCHING ° If you experience itching with your medications, try taking only a single pain pill, or even half a pain pill at a time.  You can also use Benadryl over the counter for itching or also to help with sleep.  ° °TED HOSE STOCKINGS °Wear the elastic stockings on both legs for three weeks following surgery during the day but you may remove then at night for sleeping. ° °MEDICATIONS °See your medication summary on the “After Visit Summary” that the nursing staff will review with you prior to discharge.  You may have some home medications which will be placed on hold until you complete the course of blood thinner medication.  It is important for you to complete the blood thinner medication as prescribed by your surgeon.  Continue your approved medications as instructed at time of  discharge. ° °PRECAUTIONS °If you experience chest pain or shortness of breath - call 911 immediately for transfer to the hospital emergency department.  °If you develop a fever greater that 101 F, purulent drainage from wound, increased redness or drainage from wound, foul odor from the wound/dressing, or calf pain - CONTACT YOUR SURGEON.   °                                                °FOLLOW-UP APPOINTMENTS °Make sure you keep all of your appointments after your operation with your surgeon and caregivers. You should call the office at the above phone number and make an appointment for approximately two weeks after the date of your surgery or on the date instructed by your surgeon outlined in the "After Visit Summary". ° ° °RANGE OF MOTION AND STRENGTHENING EXERCISES  °Rehabilitation of the knee is important following a knee injury or   an operation. After just a few days of immobilization, the muscles of the thigh which control the knee become weakened and shrink (atrophy). Knee exercises are designed to build up the tone and strength of the thigh muscles and to improve knee motion. Often times heat used for twenty to thirty minutes before working out will loosen up your tissues and help with improving the range of motion but do not use heat for the first two weeks following surgery. These exercises can be done on a training (exercise) mat, on the floor, on a table or on a bed. Use what ever works the best and is most comfortable for you Knee exercises include:  °Leg Lifts - While your knee is still immobilized in a splint or cast, you can do straight leg raises. Lift the leg to 60 degrees, hold for 3 sec, and slowly lower the leg. Repeat 10-20 times 2-3 times daily. Perform this exercise against resistance later as your knee gets better.  °Quad and Hamstring Sets - Tighten up the muscle on the front of the thigh (Quad) and hold for 5-10 sec. Repeat this 10-20 times hourly. Hamstring sets are done by pushing the  foot backward against an object and holding for 5-10 sec. Repeat as with quad sets.  °· Leg Slides: Lying on your back, slowly slide your foot toward your buttocks, bending your knee up off the floor (only go as far as is comfortable). Then slowly slide your foot back down until your leg is flat on the floor again. °· Angel Wings: Lying on your back spread your legs to the side as far apart as you can without causing discomfort.  °A rehabilitation program following serious knee injuries can speed recovery and prevent re-injury in the future due to weakened muscles. Contact your doctor or a physical therapist for more information on knee rehabilitation.  ° °IF YOU ARE TRANSFERRED TO A SKILLED REHAB FACILITY °If the patient is transferred to a skilled rehab facility following release from the hospital, a list of the current medications will be sent to the facility for the patient to continue.  When discharged from the skilled rehab facility, please have the facility set up the patient's Home Health Physical Therapy prior to being released. Also, the skilled facility will be responsible for providing the patient with their medications at time of release from the facility to include their pain medication, the muscle relaxants, and their blood thinner medication. If the patient is still at the rehab facility at time of the two week follow up appointment, the skilled rehab facility will also need to assist the patient in arranging follow up appointment in our office and any transportation needs. ° °MAKE SURE YOU:  °Understand these instructions.  °Get help right away if you are not doing well or get worse.  ° ° °Pick up stool softner and laxative for home use following surgery while on pain medications. °Do not submerge incision under water. °Please use good hand washing techniques while changing dressing each day. °May shower starting three days after surgery. °Please use a clean towel to pat the incision dry following  showers. °Continue to use ice for pain and swelling after surgery. °Do not use any lotions or creams on the incision until instructed by your surgeon. ° °Take Xarelto for two and a half more weeks, then discontinue Xarelto. °Once the patient has completed the Xarelto, they may resume the 81 mg Aspirin. ° ° °Information on my medicine - XARELTO® (Rivaroxaban) ° °  This medication education was reviewed with me or my healthcare representative as part of my discharge preparation.   ° °Why was Xarelto® prescribed for you? °Xarelto® was prescribed for you to reduce the risk of blood clots forming after orthopedic surgery. The medical term for these abnormal blood clots is venous thromboembolism (VTE). ° °What do you need to know about xarelto® ? °Take your Xarelto® ONCE DAILY at the same time every day. °You may take it either with or without food. ° °If you have difficulty swallowing the tablet whole, you may crush it and mix in applesauce just prior to taking your dose. ° °Take Xarelto® exactly as prescribed by your doctor and DO NOT stop taking Xarelto® without talking to the doctor who prescribed the medication.  Stopping without other VTE prevention medication to take the place of Xarelto® may increase your risk of developing a clot. ° °After discharge, you should have regular check-up appointments with your healthcare provider that is prescribing your Xarelto®.   ° °What do you do if you miss a dose? °If you miss a dose, take it as soon as you remember on the same day then continue your regularly scheduled once daily regimen the next day. Do not take two doses of Xarelto® on the same day.  ° °Important Safety Information °A possible side effect of Xarelto® is bleeding. You should call your healthcare provider right away if you experience any of the following: °? Bleeding from an injury or your nose that does not stop. °? Unusual colored urine (red or dark brown) or unusual colored stools (red or black). °? Unusual  bruising for unknown reasons. °? A serious fall or if you hit your head (even if there is no bleeding). ° °Some medicines may interact with Xarelto® and might increase your risk of bleeding while on Xarelto®. To help avoid this, consult your healthcare provider or pharmacist prior to using any new prescription or non-prescription medications, including herbals, vitamins, non-steroidal anti-inflammatory drugs (NSAIDs) and supplements. ° °This website has more information on Xarelto®: www.xarelto.com. ° ° °

## 2015-03-02 DIAGNOSIS — M2550 Pain in unspecified joint: Secondary | ICD-10-CM | POA: Diagnosis not present

## 2015-03-02 DIAGNOSIS — G8918 Other acute postprocedural pain: Secondary | ICD-10-CM | POA: Diagnosis not present

## 2015-03-02 DIAGNOSIS — E785 Hyperlipidemia, unspecified: Secondary | ICD-10-CM | POA: Diagnosis not present

## 2015-03-02 DIAGNOSIS — D649 Anemia, unspecified: Secondary | ICD-10-CM | POA: Diagnosis not present

## 2015-03-02 DIAGNOSIS — Z96652 Presence of left artificial knee joint: Secondary | ICD-10-CM | POA: Diagnosis not present

## 2015-03-02 DIAGNOSIS — R262 Difficulty in walking, not elsewhere classified: Secondary | ICD-10-CM | POA: Diagnosis not present

## 2015-03-02 DIAGNOSIS — K219 Gastro-esophageal reflux disease without esophagitis: Secondary | ICD-10-CM | POA: Diagnosis not present

## 2015-03-02 DIAGNOSIS — M1712 Unilateral primary osteoarthritis, left knee: Secondary | ICD-10-CM | POA: Diagnosis not present

## 2015-03-02 DIAGNOSIS — Z471 Aftercare following joint replacement surgery: Secondary | ICD-10-CM | POA: Diagnosis not present

## 2015-03-02 LAB — BASIC METABOLIC PANEL
Anion gap: 6 (ref 5–15)
BUN: 13 mg/dL (ref 6–20)
CHLORIDE: 103 mmol/L (ref 101–111)
CO2: 30 mmol/L (ref 22–32)
CREATININE: 0.57 mg/dL (ref 0.44–1.00)
Calcium: 8.8 mg/dL — ABNORMAL LOW (ref 8.9–10.3)
GFR calc non Af Amer: >60 mL/min (ref >60)
Glucose, Bld: 117 mg/dL — ABNORMAL HIGH (ref 65–99)
POTASSIUM: 3.8 mmol/L (ref 3.5–5.1)
Sodium: 139 mmol/L (ref 135–145)

## 2015-03-02 LAB — CBC
HEMATOCRIT: 33.4 % — AB (ref 36.0–46.0)
HEMOGLOBIN: 11.2 g/dL — AB (ref 12.0–15.0)
MCH: 29.6 pg (ref 26.0–34.0)
MCHC: 33.5 g/dL (ref 30.0–36.0)
MCV: 88.1 fL (ref 78.0–100.0)
Platelets: 231 10*3/uL (ref 150–400)
RBC: 3.79 MIL/uL — AB (ref 3.87–5.11)
RDW: 13.6 % (ref 11.5–15.5)
WBC: 10.1 10*3/uL (ref 4.0–10.5)

## 2015-03-02 MED ORDER — OXYCODONE HCL 5 MG PO TABS: 5.0000 mg | ORAL_TABLET | ORAL | Status: DC | PRN

## 2015-03-02 MED ORDER — RIVAROXABAN 10 MG PO TABS: 10.0000 mg | ORAL_TABLET | Freq: Every day | ORAL | Status: DC

## 2015-03-02 MED ORDER — DIPHENHYDRAMINE HCL 12.5 MG/5ML PO ELIX: 12.5000 mg | ORAL_SOLUTION | ORAL | Status: DC | PRN

## 2015-03-02 MED ORDER — ONDANSETRON HCL 4 MG PO TABS: 4.0000 mg | ORAL_TABLET | Freq: Four times a day (QID) | ORAL | Status: DC | PRN

## 2015-03-02 MED ORDER — TRAMADOL HCL 50 MG PO TABS: 50.0000 mg | ORAL_TABLET | Freq: Four times a day (QID) | ORAL | Status: DC | PRN

## 2015-03-02 MED ORDER — BISACODYL 10 MG RE SUPP: 10.0000 mg | Freq: Every day | RECTAL | Status: DC | PRN

## 2015-03-02 MED ORDER — DOCUSATE SODIUM 100 MG PO CAPS: 100.0000 mg | ORAL_CAPSULE | Freq: Two times a day (BID) | ORAL | Status: DC

## 2015-03-02 MED ORDER — METOCLOPRAMIDE HCL 5 MG PO TABS: 5.0000 mg | ORAL_TABLET | Freq: Three times a day (TID) | ORAL | Status: DC | PRN

## 2015-03-02 MED ORDER — METHOCARBAMOL 500 MG PO TABS: 500.0000 mg | ORAL_TABLET | Freq: Four times a day (QID) | ORAL | Status: DC | PRN

## 2015-03-02 NOTE — Progress Notes (Signed)
Physical Therapy Treatment Patient Details Name: Robyn Lambert MRN: [id number] DOB: January 17, 1941 Today's Date: Mar 25, 2015    History of Present Illness L TKR    PT Comments    Performed therex.  OOB deferred at pt request for break after therex.  Follow Up Recommendations  SNF     Equipment Recommendations  None recommended by PT    Recommendations for Other Services OT consult     Precautions / Restrictions Precautions Precautions: Knee;Fall Required Braces or Orthoses: Knee Immobilizer - Left Knee Immobilizer - Left: Discontinue once straight leg raise with < 10 degree lag Restrictions Weight Bearing Restrictions: No Other Position/Activity Restrictions: WBAT    Mobility  Bed Mobility                  Transfers                    Ambulation/Gait                 Stairs            Wheelchair Mobility    Modified Rankin (Stroke Patients Only)       Balance                                    Cognition Arousal/Alertness: Awake/alert Behavior During Therapy: WFL for tasks assessed/performed Overall Cognitive Status: Within Functional Limits for tasks assessed                      Exercises Total Joint Exercises Ankle Circles/Pumps: AROM;Both;15 reps;Supine Quad Sets: AROM;Both;Supine;15 reps Heel Slides: AAROM;Left;15 reps;Supine Straight Leg Raises: AAROM;Left;15 reps;Supine    General Comments        Pertinent Vitals/Pain Pain Assessment: 0-10 Pain Score: 6  Pain Location: L knee Pain Descriptors / Indicators: Aching;Sore Pain Intervention(s): Limited activity within patient's tolerance;Monitored during session;Premedicated before session;Ice applied    Home Living                      Prior Function            PT Goals (current goals can now be found in the care plan section) Acute Rehab PT Goals Patient Stated Goal: Rehab at [facility] and home to resume previous lifestyle PT Goal  Formulation: With patient Time For Goal Achievement: 03/06/15 Potential to Achieve Goals: Good Progress towards PT goals: Progressing toward goals    Frequency  7X/week    PT Plan Current plan remains appropriate    Co-evaluation             End of Session   Activity Tolerance: No increased pain Patient left: in bed;with call bell/phone within reach;with family/visitor present     Time: 8921-1941 PT Time Calculation (min) (ACUTE ONLY): 19 min  Charges:  $Therapeutic Exercise: 8-22 mins                    G Codes:      Robyn Lambert 03/25/15, 12:27 PM

## 2015-03-02 NOTE — Progress Notes (Signed)
Physical Therapy Treatment Patient Details Name: Robyn Lambert MRN: [id number] DOB: [birth year] Today's Date: 03/02/2015    History of Present Illness L TKR    PT Comments    Steady progress with mobility.  Pt eager for progression to rehab at SNF  Follow Up Recommendations  SNF     Equipment Recommendations  None recommended by PT    Recommendations for Other Services OT consult     Precautions / Restrictions Precautions Precautions: Knee;Fall Required Braces or Orthoses: Knee Immobilizer - Left Knee Immobilizer - Left: Discontinue once straight leg raise with < 10 degree lag Restrictions Weight Bearing Restrictions: No Other Position/Activity Restrictions: WBAT    Mobility  Bed Mobility Overal bed mobility: Needs Assistance Bed Mobility: Supine to Sit     Supine to sit: Min assist     General bed mobility comments: cues for sequence and use of R LE to self assist  Transfers Overall transfer level: Needs assistance Equipment used: Rolling walker (2 wheeled) Transfers: Sit to/from Stand Sit to Stand: Min assist         General transfer comment: cues for LE management and use of UEs to self assist  Ambulation/Gait Ambulation/Gait assistance: Min assist;Min guard Ambulation Distance (Feet): 101 Feet Assistive device: Rolling walker (2 wheeled) Gait Pattern/deviations: Step-to pattern;Decreased step length - right;Decreased step length - left;Shuffle;Trunk flexed Gait velocity: decr   General Gait Details: cues for sequence, posture and position from [name]            Wheelchair Mobility    Modified Rankin (Stroke Patients Only)       Balance                                    Cognition Arousal/Alertness: Awake/alert Behavior During Therapy: WFL for tasks assessed/performed Overall Cognitive Status: Within Functional Limits for tasks assessed                      Exercises Total Joint Exercises Ankle  Circles/Pumps: AROM;Both;15 reps;Supine Quad Sets: AROM;Both;Supine;15 reps Heel Slides: AAROM;Left;15 reps;Supine Straight Leg Raises: AAROM;Left;15 reps;Supine    General Comments        Pertinent Vitals/Pain Pain Assessment: 0-10 Pain Score: 4  Pain Location: L knee Pain Descriptors / Indicators: Aching;Sore Pain Intervention(s): Limited activity within patient's tolerance;Monitored during session;Premedicated before session    Home Living                      Prior Function            PT Goals (current goals can now be found in the care plan section) Acute Rehab PT Goals Patient Stated Goal: Rehab at [facility] and home to resume previous lifestyle PT Goal Formulation: With patient Time For Goal Achievement: 03/06/15 Potential to Achieve Goals: Good Progress towards PT goals: Progressing toward goals    Frequency  7X/week    PT Plan Current plan remains appropriate    Co-evaluation             End of Session Equipment Utilized During Treatment: Gait belt;Left knee immobilizer Activity Tolerance: Patient tolerated treatment well;Patient limited by fatigue;Patient limited by pain Patient left: Other (comment) (bathroom with dtr)     Time: 8185-6314 PT Time Calculation (min) (ACUTE ONLY): 16 min  Charges:  $Gait Training: 8-22 mins $Therapeutic Exercise: 8-22 mins  G Codes:      [name] 03/05/15, 12:31 PM

## 2015-03-02 NOTE — Progress Notes (Signed)
Attempted to call report to [facility] in [place] twice. Was put on hold for extended period of time.

## 2015-03-02 NOTE — Progress Notes (Signed)
OT Cancellation Note  Patient Details Name: Robyn Lambert MRN: [id number] DOB: Jun 27, 1941   Cancelled Treatment:    Reason Eval/Treat Not Completed: Other (comment).  Plan is for SNF today; will defer OT evaluation to that venue.  Samarah Hogle 03/02/2015, 12:06 PM  Lesle Chris, OTR/L [phone] 03/02/2015

## 2015-03-02 NOTE — Discharge Summary (Signed)
Physician Discharge Summary   Patient ID: Robyn Lambert MRN: [id number] DOB/AGE: 73-Jul-1942 73 y.o.  Admit date: 02/28/2015 Discharge date: 03/02/2015  Primary Diagnosis:  Osteoarthritis Left knee(s)  Admission Diagnoses:  Past Medical History  Diagnosis Date  . Hyperlipidemia   . Arthritis   . GERD (gastroesophageal reflux disease)   . Rectocele   . Anxiety   . Joint pain     MULTIPLE ARTHRITIC JOINTS  . Thyroid disease    Discharge Diagnoses:   Principal Problem:   OA (osteoarthritis) of knee  Estimated body mass index is 32.45 kg/(m^2) as calculated from the following:   Height as of this encounter: 5' 5"  (1.651 m).   Weight as of this encounter: 88.451 kg (195 lb).  Procedure:  Procedure(s) (LRB): LEFT TOTAL KNEE ARTHROPLASTY (Left)   Consults: None  HPI: Robyn Lambert is a 74 y.o. year old female with end stage OA of her left knee with progressively worsening pain and dysfunction. She has constant pain, with activity and at rest and significant functional deficits with difficulties even with ADLs. She has had extensive non-op management including analgesics, injections of cortisone and viscosupplements, and home exercise program, but remains in significant pain with significant dysfunction. Radiographs show bone on bone arthritis medial and patellofemoral. She presents now for left Total Knee Arthroplasty.  Laboratory Data: Admission on 02/28/2015  Component Date Value Ref Range Status  . WBC 03/01/2015 8.8  4.0 - 10.5 K/uL Final  . RBC 03/01/2015 3.84* 3.87 - 5.11 MIL/uL Final  . Hemoglobin 03/01/2015 10.9* 12.0 - 15.0 g/dL Final  . HCT 03/01/2015 33.4* 36.0 - 46.0 % Final  . MCV 03/01/2015 87.0  78.0 - 100.0 fL Final  . MCH 03/01/2015 28.4  26.0 - 34.0 pg Final  . MCHC 03/01/2015 32.6  30.0 - 36.0 g/dL Final  . RDW 03/01/2015 13.3  11.5 - 15.5 % Final  . Platelets 03/01/2015 177  150 - 400 K/uL Final  . Sodium 03/01/2015 138  135 - 145 mmol/L Final  . Potassium  03/01/2015 3.9  3.5 - 5.1 mmol/L Final  . Chloride 03/01/2015 104  101 - 111 mmol/L Final  . CO2 03/01/2015 27  22 - 32 mmol/L Final  . Glucose, Bld 03/01/2015 145* 65 - 99 mg/dL Final  . BUN 03/01/2015 10  6 - 20 mg/dL Final  . Creatinine, Ser 03/01/2015 0.58  0.44 - 1.00 mg/dL Final  . Calcium 03/01/2015 8.9  8.9 - 10.3 mg/dL Final  . GFR calc non Af Amer 03/01/2015 >60  >60 mL/min Final  . GFR calc Af Amer 03/01/2015 >60  >60 mL/min Final   Comment: (NOTE) The eGFR has been calculated using the CKD EPI equation. This calculation has not been validated in all clinical situations. eGFR's persistently <60 mL/min signify possible Chronic Kidney Disease.   . Anion gap 03/01/2015 7  5 - 15 Final  . WBC 03/02/2015 10.1  4.0 - 10.5 K/uL Final  . RBC 03/02/2015 3.79* 3.87 - 5.11 MIL/uL Final  . Hemoglobin 03/02/2015 11.2* 12.0 - 15.0 g/dL Final  . HCT 03/02/2015 33.4* 36.0 - 46.0 % Final  . MCV 03/02/2015 88.1  78.0 - 100.0 fL Final  . MCH 03/02/2015 29.6  26.0 - 34.0 pg Final  . MCHC 03/02/2015 33.5  30.0 - 36.0 g/dL Final  . RDW 03/02/2015 13.6  11.5 - 15.5 % Final  . Platelets 03/02/2015 231  150 - 400 K/uL Final  . Sodium 03/02/2015 139  135 - 145  mmol/L Final  . Potassium 03/02/2015 3.8  3.5 - 5.1 mmol/L Final  . Chloride 03/02/2015 103  101 - 111 mmol/L Final  . CO2 03/02/2015 30  22 - 32 mmol/L Final  . Glucose, Bld 03/02/2015 117* 65 - 99 mg/dL Final  . BUN 03/02/2015 13  6 - 20 mg/dL Final  . Creatinine, Ser 03/02/2015 0.57  0.44 - 1.00 mg/dL Final  . Calcium 03/02/2015 8.8* 8.9 - 10.3 mg/dL Final  . GFR calc non Af Amer 03/02/2015 >60  >60 mL/min Final  . GFR calc Af Amer 03/02/2015 >60  >60 mL/min Final   Comment: (NOTE) The eGFR has been calculated using the CKD EPI equation. This calculation has not been validated in all clinical situations. eGFR's persistently <60 mL/min signify possible Chronic Kidney Disease.   Georgiann Hahn gap 03/02/2015 6  5 - 15 Final  Hospital  Outpatient Visit on 02/21/2015  Component Date Value Ref Range Status  . MRSA, PCR 02/21/2015 NEGATIVE  NEGATIVE Final  . Staphylococcus aureus 02/21/2015 NEGATIVE  NEGATIVE Final   Comment:        The Xpert SA Assay (FDA approved for NASAL specimens in patients over 73 years of age), is one component of a comprehensive surveillance program.  Test performance has been validated by [facility] for patients greater than or equal to 73 year old. It is not intended to diagnose infection nor to guide or monitor treatment.   Marland Kitchen aPTT 02/21/2015 34  24 - 37 seconds Final  . WBC 02/21/2015 4.8  4.0 - 10.5 K/uL Final  . RBC 02/21/2015 4.39  3.87 - 5.11 MIL/uL Final  . Hemoglobin 02/21/2015 12.6  12.0 - 15.0 g/dL Final  . HCT 02/21/2015 39.1  36.0 - 46.0 % Final  . MCV 02/21/2015 89.1  78.0 - 100.0 fL Final  . MCH 02/21/2015 28.7  26.0 - 34.0 pg Final  . MCHC 02/21/2015 32.2  30.0 - 36.0 g/dL Final  . RDW 02/21/2015 13.4  11.5 - 15.5 % Final  . Platelets 02/21/2015 261  150 - 400 K/uL Final  . Sodium 02/21/2015 141  135 - 145 mmol/L Final  . Potassium 02/21/2015 4.3  3.5 - 5.1 mmol/L Final  . Chloride 02/21/2015 103  101 - 111 mmol/L Final  . CO2 02/21/2015 30  22 - 32 mmol/L Final  . Glucose, Bld 02/21/2015 109* 65 - 99 mg/dL Final  . BUN 02/21/2015 14  6 - 20 mg/dL Final  . Creatinine, Ser 02/21/2015 0.63  0.44 - 1.00 mg/dL Final  . Calcium 02/21/2015 9.6  8.9 - 10.3 mg/dL Final  . Total Protein 02/21/2015 7.4  6.5 - 8.1 g/dL Final  . Albumin 02/21/2015 4.4  3.5 - 5.0 g/dL Final  . AST 02/21/2015 21  15 - 41 U/L Final  . ALT 02/21/2015 14  14 - 54 U/L Final  . Alkaline Phosphatase 02/21/2015 121  38 - 126 U/L Final  . Total Bilirubin 02/21/2015 0.4  0.3 - 1.2 mg/dL Final  . GFR calc non Af Amer 02/21/2015 >60  >60 mL/min Final  . GFR calc Af Amer 02/21/2015 >60  >60 mL/min Final   Comment: (NOTE) The eGFR has been calculated using the CKD EPI equation. This calculation has not been  validated in all clinical situations. eGFR's persistently <60 mL/min signify possible Chronic Kidney Disease.   . Anion gap 02/21/2015 8  5 - 15 Final  . Prothrombin Time 02/21/2015 13.1  11.6 - 15.2 seconds Final  .  INR 02/21/2015 0.97  0.00 - 1.49 Final  . ABO/RH(D) 02/21/2015 O POS   Final  . Antibody Screen 02/21/2015 NEG   Final  . Sample Expiration 02/21/2015 03/03/2015   Final  . Color, Urine 02/21/2015 YELLOW  YELLOW Final  . APPearance 02/21/2015 CLEAR  CLEAR Final  . Specific Gravity, Urine 02/21/2015 1.005  1.005 - 1.030 Final  . pH 02/21/2015 7.0  5.0 - 8.0 Final  . Glucose, UA 02/21/2015 NEGATIVE  NEGATIVE mg/dL Final  . Hgb urine dipstick 02/21/2015 NEGATIVE  NEGATIVE Final  . Bilirubin Urine 02/21/2015 NEGATIVE  NEGATIVE Final  . Ketones, ur 02/21/2015 NEGATIVE  NEGATIVE mg/dL Final  . Protein, ur 02/21/2015 NEGATIVE  NEGATIVE mg/dL Final  . Urobilinogen, UA 02/21/2015 0.2  0.0 - 1.0 mg/dL Final  . Nitrite 02/21/2015 NEGATIVE  NEGATIVE Final  . Leukocytes, UA 02/21/2015 NEGATIVE  NEGATIVE Final   MICROSCOPIC NOT DONE ON URINES WITH NEGATIVE PROTEIN, BLOOD, LEUKOCYTES, NITRITE, OR GLUCOSE <1000 mg/dL.  . ABO/RH(D) 02/21/2015 O POS   Final     X-Rays:No results found.  EKG:No orders found for this or any previous visit.   Hospital Course: Robyn Lambert is a 74 y.o. who was admitted to [facility]. They were brought to the operating room on 02/28/2015 and underwent Procedure(s): LEFT TOTAL KNEE ARTHROPLASTY.  Patient tolerated the procedure well and was later transferred to the recovery room and then to the orthopaedic floor for postoperative care.  They were given PO and IV analgesics for pain control following their surgery.  They were given 24 hours of postoperative antibiotics of  Anti-infectives    Start     Dose/Rate Route Frequency Ordered Stop   02/28/15 1900  ceFAZolin (ANCEF) IVPB 2 g/50 mL premix     2 g 100 mL/hr over 30 Minutes Intravenous Every 6  hours 02/28/15 1558 03/01/15 0114   02/28/15 1040  ceFAZolin (ANCEF) IVPB 2 g/50 mL premix     2 g 100 mL/hr over 30 Minutes Intravenous On call to O.R. 02/28/15 1040 02/28/15 1230     and started on DVT prophylaxis in the form of Xarelto.   PT and OT were ordered for total joint protocol.  Discharge planning consulted to help with postop disposition and equipment needs. Social worker consulted to assist with placement of the patient into a SNF for inpatient rehab.  Patient had a rough night on the evening of surgery but was a little better the following morning.  They started to get up OOB with therapy on day one. Hemovac drain was pulled without difficulty.  Continued to work with therapy into day two.  Dressing was changed on day two and the incision was healing well with little swelling.  Patient was seen in rounds on day two and it was felt as long as the insurance is approved and a bed available, she would be ready to go to the SNF. Awaiting final approval at time of summary.   Discharge to SNF - [facility] Diet - Cardiac diet Follow up - in 2 weeks Activity - WBAT Disposition - Skilled nursing facility Condition Upon Discharge - Good D/C Meds - See DC Summary DVT Prophylaxis - Xarelto  Discharge Instructions    Call MD / Call 911    Complete by:  As directed   If you experience chest pain or shortness of breath, CALL 911 and be transported to the hospital emergency room.  If you develope a fever above 101 F,  pus (white drainage) or increased drainage or redness at the wound, or calf pain, call your surgeon's office.     Change dressing    Complete by:  As directed   Change dressing daily with sterile 4 x 4 inch gauze dressing and apply TED hose. Do not submerge the incision under water.     Constipation Prevention    Complete by:  As directed   Drink plenty of fluids.  Prune juice may be helpful.  You may use a stool softener, such as Colace (over the counter) 100 mg twice a  day.  Use MiraLax (over the counter) for constipation as needed.     Diet - low sodium heart healthy    Complete by:  As directed      Discharge instructions    Complete by:  As directed   Pick up stool softner and laxative for home use following surgery while on pain medications. Do not submerge incision under water. Please use good hand washing techniques while changing dressing each day. May shower starting three days after surgery on Saturday 03/03/2015 Please use a clean towel to pat the incision dry following showers. Continue to use ice for pain and swelling after surgery. Do not use any lotions or creams on the incision until instructed by your surgeon.  Take Xarelto for two and a half more weeks, then discontinue Xarelto. Once the patient has completed the Xarelto, they may resume the 81 mg Aspirin.  Postoperative Constipation Protocol  Constipation - defined medically as fewer than three stools per week and severe constipation as less than one stool per week.  One of the most common issues patients have following surgery is constipation.  Even if you have a regular bowel pattern at home, your normal regimen is likely to be disrupted due to multiple reasons following surgery.  Combination of anesthesia, postoperative narcotics, change in appetite and fluid intake all can affect your bowels.  In order to avoid complications following surgery, here are some recommendations in order to help you during your recovery period.  Colace (docusate) - Pick up an over-the-counter form of Colace or another stool softener and take twice a day as long as you are requiring postoperative pain medications.  Take with a full glass of water daily.  If you experience loose stools or diarrhea, hold the colace until you stool forms back up.  If your symptoms do not get better within 1 week or if they get worse, check with your doctor.  Dulcolax (bisacodyl) - Pick up over-the-counter and take as directed by  the product packaging as needed to assist with the movement of your bowels.  Take with a full glass of water.  Use this product as needed if not relieved by Colace only.   MiraLax (polyethylene glycol) - Pick up over-the-counter to have on hand.  MiraLax is a solution that will increase the amount of water in your bowels to assist with bowel movements.  Take as directed and can mix with a glass of water, juice, soda, coffee, or tea.  Take if you go more than two days without a movement. Do not use MiraLax more than once per day. Call your doctor if you are still constipated or irregular after using this medication for 7 days in a row.  If you continue to have problems with postoperative constipation, please contact the office for further assistance and recommendations.  If you experience "the worst abdominal pain ever" or develop nausea or vomiting, please  contact the office immediatly for further recommendations for treatment.  When discharged from the skilled rehab facility, please have the facility set up the patient's [place] prior to being released.  Please make sure this gets set up prior to release in order to avoid any lapse of therapy following the rehab stay.  Also provide the patient with their medications at time of release from the facility to include their pain medication, the muscle relaxants, and their blood thinner medication.  If the patient is still at the rehab facility at time of follow up appointment, please also assist the patient in arranging follow up appointment in our office and any transportation needs. ICE to the affected knee or hip every three hours for 30 minutes at a time and then as needed for pain and swelling.     Do not put a pillow under the knee. Place it under the heel.    Complete by:  As directed      Do not sit on low chairs, stoools or toilet seats, as it may be difficult to get up from low surfaces    Complete by:  As directed       Driving restrictions    Complete by:  As directed   No driving until released by the physician.     Increase activity slowly as tolerated    Complete by:  As directed      Lifting restrictions    Complete by:  As directed   No lifting until released by the physician.     Patient may shower    Complete by:  As directed   You may shower without a dressing once there is no drainage.  Do not wash over the wound.  If drainage remains, do not shower until drainage stops.     TED hose    Complete by:  As directed   Use stockings (TED hose) for 3 weeks on both leg(s).  You may remove them at night for sleeping.     Weight bearing as tolerated    Complete by:  As directed   Laterality:  left  Extremity:  Lower            Medication List    STOP taking these medications        aspirin EC 81 MG tablet     CALCIUM CITRATE + PO     glucosamine-chondroitin 500-400 MG tablet     multivitamin with minerals Tabs tablet     RED YEAST RICE PO     VITAMIN D PO      TAKE these medications        acetaminophen 500 MG tablet  Commonly known as:  TYLENOL  Take 500-1,000 mg by mouth every 6 (six) hours as needed for moderate pain or headache.     bisacodyl 10 MG suppository  Commonly known as:  DULCOLAX  Place 1 suppository (10 mg total) rectally daily as needed for moderate constipation.     diphenhydrAMINE 12.5 MG/5ML elixir  Commonly known as:  BENADRYL  Take 5-10 mLs (12.5-25 mg total) by mouth every 4 (four) hours as needed for itching.     docusate sodium 100 MG capsule  Commonly known as:  COLACE  Take 1 capsule (100 mg total) by mouth 2 (two) times daily. HOLD for loose stool or diarrhea.     levothyroxine 50 MCG tablet  Commonly known as:  SYNTHROID, LEVOTHROID  Take 50 mcg by mouth daily  before breakfast.     MAGNESIUM GLUCONATE PO  Take 1 tablet by mouth at bedtime.     methocarbamol 500 MG tablet  Commonly known as:  ROBAXIN  Take 1 tablet (500 mg total) by mouth  every 6 (six) hours as needed for muscle spasms.     metoCLOPramide 5 MG tablet  Commonly known as:  REGLAN  Take 1 tablet (5 mg total) by mouth every 8 (eight) hours as needed for nausea (if ondansetron (ZOFRAN) ineffective.).     ondansetron 4 MG tablet  Commonly known as:  ZOFRAN  Take 1 tablet (4 mg total) by mouth every 6 (six) hours as needed for nausea.     oxyCODONE 5 MG immediate release tablet  Commonly known as:  Oxy IR/ROXICODONE  Take 1-2 tablets (5-10 mg total) by mouth every 3 (three) hours as needed for moderate pain or severe pain.     polyethylene glycol packet  Commonly known as:  MIRALAX / GLYCOLAX  Take 17 g by mouth at bedtime.     rivaroxaban 10 MG Tabs tablet  Commonly known as:  XARELTO  Take 1 tablet (10 mg total) by mouth daily with breakfast. Take Xarelto for two and a half more weeks, then discontinue Xarelto. Once the patient has completed the Xarelto, they may resume the 81 mg Aspirin.     traMADol 50 MG tablet  Commonly known as:  ULTRAM  Take 1-2 tablets (50-100 mg total) by mouth every 6 (six) hours as needed (mild pain).           Follow-up Information    Follow up with [name], MD. Schedule an appointment as soon as possible for a visit on 03/13/2015.   Specialty:  Orthopedic Surgery   Why:  Call office ASAP at [phone] to setup appointment on Tuesday 03/13/2015 with Dr. Wynelle Link.   Contact information:   [address] [place] [address] [phone]       Signed: Arlee Muslim, PA-C Orthopaedic Surgery 03/02/2015, 7:55 AM

## 2015-03-02 NOTE — Progress Notes (Signed)
Pt for discharge to [facility].   CSW faciliated pt discharge needs including contacting facility, faxing pt discharge information via [facility], discussing with pt and pt daughter at bedside, providing RN phone number to call report, and providing discharge packet to RN to provide to pt and pt family to provide to [facility] upon arrival.   Pt eager for transition to [facility] for rehab.  No further social work needs identified at this time.  CSW signing off.   Alison Murray, MSW, [facility] [phone]

## 2015-03-02 NOTE — Clinical Social Work Placement (Signed)
   CLINICAL SOCIAL WORK PLACEMENT  NOTE  Date:  03/02/2015  Patient Details  Name: Robyn Lambert MRN: [id number] Date of Birth: 01-29-1941  Clinical Social Work is seeking post-discharge placement for this patient at the [place] level of care (*CSW will initial, date and re-position this form in  chart as items are completed):  No (pt pre-registered at [facility])   Patient/family provided with [facility] Work Department's list of facilities offering this level of care within the geographic area requested by the patient (or if unable, by the patient's family).  No   Patient/family informed of their freedom to choose among providers that offer the needed level of care, that participate in Medicare, Medicaid or managed care program needed by the patient, have an available bed and are willing to accept the patient.  No   Patient/family informed of [facility]'s ownership interest in [facility] and [facility], as well as of the fact that they are under no obligation to receive care at these facilities.  PASRR submitted to EDS on 03/01/15     PASRR number received on 03/01/15     Existing PASRR number confirmed on       FL2 transmitted to all facilities in geographic area requested by pt/family on 03/01/15     FL2 transmitted to all facilities within larger geographic area on       Patient informed that his/her managed care company has contracts with or will negotiate with certain facilities, including the following:        Yes   Patient/family informed of bed offers received.  Patient chooses bed at [place]     Physician recommends and patient chooses bed at      Patient to be transferred to [facility] on 03/02/15.  Patient to be transferred to facility by pt family via private vehicle     Patient family notified on 03/02/15 of transfer.  Name of family member notified:  pt and pt daughter, Abner Greenspan notified at bedside      PHYSICIAN Please sign FL2     Additional Comment:    _______________________________________________ Ladell Pier, LCSW 03/02/2015, 11:31 AM

## 2015-03-02 NOTE — Progress Notes (Signed)
   Subjective: 2 Days Post-Op Procedure(s) (LRB): LEFT TOTAL KNEE ARTHROPLASTY (Left) Patient reports pain as mild.   Patient seen in rounds with Dr. Wynelle Link.  Doing better each day. Patient is well, but has had some minor complaints of pain in the knee, requiring pain medications Patient is ready to go to the SNF if bed available.  Objective: Vital signs in last 24 hours: Temp:  [97.8 F (36.6 C)-98.3 F (36.8 C)] 98.3 F (36.8 C) (07/22 0521) Pulse Rate:  [75-93] 93 (07/22 0521) Resp:  [14-16] 14 (07/22 0521) BP: (122-151)/(57-70) 151/70 mmHg (07/22 0521) SpO2:  [97 %-100 %] 97 % (07/22 0521)  Intake/Output from previous day:  Intake/Output Summary (Last 24 hours) at 03/02/15 0746 Last data filed at 03/02/15 0522  Gross per 24 hour  Intake   1920 ml  Output    850 ml  Net   1070 ml    Labs:  Recent Labs  03/01/15 0415 03/02/15 0429  HGB 10.9* 11.2*    Recent Labs  03/01/15 0415 03/02/15 0429  WBC 8.8 10.1  RBC 3.84* 3.79*  HCT 33.4* 33.4*  PLT 177 231    Recent Labs  03/01/15 0415 03/02/15 0429  NA 138 139  K 3.9 3.8  CL 104 103  CO2 27 30  BUN 10 13  CREATININE 0.58 0.57  GLUCOSE 145* 117*  CALCIUM 8.9 8.8*   No results for input(s): LABPT, INR in the last 72 hours.  EXAM: General - Patient is Alert, Appropriate and Oriented Extremity - Neurovascular intact Sensation intact distally Dorsiflexion/Plantar flexion intact Incision - clean, dry, no drainage Motor Function - intact, moving foot and toes well on exam.   Assessment/Plan: 2 Days Post-Op Procedure(s) (LRB): LEFT TOTAL KNEE ARTHROPLASTY (Left) Procedure(s) (LRB): LEFT TOTAL KNEE ARTHROPLASTY (Left) Past Medical History  Diagnosis Date  . Hyperlipidemia   . Arthritis   . GERD (gastroesophageal reflux disease)   . Rectocele   . Anxiety   . Joint pain     MULTIPLE ARTHRITIC JOINTS  . Thyroid disease    Principal Problem:   OA (osteoarthritis) of knee  Estimated body mass  index is 32.45 kg/(m^2) as calculated from the following:   Height as of this encounter: 5\' 5"  (1.651 m).   Weight as of this encounter: 88.451 kg (195 lb). Up with therapy Discharge to SNF - [facility] Diet - Cardiac diet Follow up - in 2 weeks Activity - WBAT Disposition - Skilled nursing facility Condition Upon Discharge - Good D/C Meds - See DC Summary DVT Prophylaxis - Xarelto  Arlee Muslim, PA-C Orthopaedic Surgery 03/02/2015, 7:46 AM

## 2015-03-04 DIAGNOSIS — G8918 Other acute postprocedural pain: Secondary | ICD-10-CM | POA: Diagnosis not present

## 2015-03-04 DIAGNOSIS — Z471 Aftercare following joint replacement surgery: Secondary | ICD-10-CM | POA: Diagnosis not present

## 2015-03-04 DIAGNOSIS — D649 Anemia, unspecified: Secondary | ICD-10-CM | POA: Diagnosis not present

## 2015-03-04 DIAGNOSIS — R262 Difficulty in walking, not elsewhere classified: Secondary | ICD-10-CM | POA: Diagnosis not present

## 2015-03-12 DIAGNOSIS — R262 Difficulty in walking, not elsewhere classified: Secondary | ICD-10-CM | POA: Diagnosis not present

## 2015-03-12 DIAGNOSIS — Z96651 Presence of right artificial knee joint: Secondary | ICD-10-CM | POA: Diagnosis not present

## 2015-03-12 DIAGNOSIS — M25561 Pain in right knee: Secondary | ICD-10-CM | POA: Diagnosis not present

## 2015-03-12 DIAGNOSIS — M62551 Muscle wasting and atrophy, not elsewhere classified, right thigh: Secondary | ICD-10-CM | POA: Diagnosis not present

## 2015-03-13 DIAGNOSIS — Z471 Aftercare following joint replacement surgery: Secondary | ICD-10-CM | POA: Diagnosis not present

## 2015-03-13 DIAGNOSIS — Z96652 Presence of left artificial knee joint: Secondary | ICD-10-CM | POA: Diagnosis not present

## 2015-03-13 DIAGNOSIS — M25562 Pain in left knee: Secondary | ICD-10-CM | POA: Diagnosis not present

## 2015-03-13 DIAGNOSIS — M1712 Unilateral primary osteoarthritis, left knee: Secondary | ICD-10-CM | POA: Diagnosis not present

## 2015-03-14 DIAGNOSIS — R262 Difficulty in walking, not elsewhere classified: Secondary | ICD-10-CM | POA: Diagnosis not present

## 2015-03-14 DIAGNOSIS — M25561 Pain in right knee: Secondary | ICD-10-CM | POA: Diagnosis not present

## 2015-03-14 DIAGNOSIS — M62551 Muscle wasting and atrophy, not elsewhere classified, right thigh: Secondary | ICD-10-CM | POA: Diagnosis not present

## 2015-03-14 DIAGNOSIS — Z96651 Presence of right artificial knee joint: Secondary | ICD-10-CM | POA: Diagnosis not present

## 2015-03-16 DIAGNOSIS — Z96651 Presence of right artificial knee joint: Secondary | ICD-10-CM | POA: Diagnosis not present

## 2015-03-16 DIAGNOSIS — M25561 Pain in right knee: Secondary | ICD-10-CM | POA: Diagnosis not present

## 2015-03-16 DIAGNOSIS — R262 Difficulty in walking, not elsewhere classified: Secondary | ICD-10-CM | POA: Diagnosis not present

## 2015-03-16 DIAGNOSIS — M62551 Muscle wasting and atrophy, not elsewhere classified, right thigh: Secondary | ICD-10-CM | POA: Diagnosis not present

## 2015-03-19 DIAGNOSIS — M25561 Pain in right knee: Secondary | ICD-10-CM | POA: Diagnosis not present

## 2015-03-19 DIAGNOSIS — Z96651 Presence of right artificial knee joint: Secondary | ICD-10-CM | POA: Diagnosis not present

## 2015-03-19 DIAGNOSIS — R262 Difficulty in walking, not elsewhere classified: Secondary | ICD-10-CM | POA: Diagnosis not present

## 2015-03-19 DIAGNOSIS — M62551 Muscle wasting and atrophy, not elsewhere classified, right thigh: Secondary | ICD-10-CM | POA: Diagnosis not present

## 2015-03-21 DIAGNOSIS — M25561 Pain in right knee: Secondary | ICD-10-CM | POA: Diagnosis not present

## 2015-03-21 DIAGNOSIS — Z96651 Presence of right artificial knee joint: Secondary | ICD-10-CM | POA: Diagnosis not present

## 2015-03-21 DIAGNOSIS — R262 Difficulty in walking, not elsewhere classified: Secondary | ICD-10-CM | POA: Diagnosis not present

## 2015-03-21 DIAGNOSIS — M62551 Muscle wasting and atrophy, not elsewhere classified, right thigh: Secondary | ICD-10-CM | POA: Diagnosis not present

## 2015-03-22 DIAGNOSIS — E039 Hypothyroidism, unspecified: Secondary | ICD-10-CM | POA: Diagnosis not present

## 2015-03-22 DIAGNOSIS — D649 Anemia, unspecified: Secondary | ICD-10-CM | POA: Diagnosis not present

## 2015-03-23 DIAGNOSIS — M62551 Muscle wasting and atrophy, not elsewhere classified, right thigh: Secondary | ICD-10-CM | POA: Diagnosis not present

## 2015-03-23 DIAGNOSIS — R262 Difficulty in walking, not elsewhere classified: Secondary | ICD-10-CM | POA: Diagnosis not present

## 2015-03-23 DIAGNOSIS — M25561 Pain in right knee: Secondary | ICD-10-CM | POA: Diagnosis not present

## 2015-03-23 DIAGNOSIS — Z96651 Presence of right artificial knee joint: Secondary | ICD-10-CM | POA: Diagnosis not present

## 2015-03-26 DIAGNOSIS — M25561 Pain in right knee: Secondary | ICD-10-CM | POA: Diagnosis not present

## 2015-03-26 DIAGNOSIS — Z96651 Presence of right artificial knee joint: Secondary | ICD-10-CM | POA: Diagnosis not present

## 2015-03-26 DIAGNOSIS — R262 Difficulty in walking, not elsewhere classified: Secondary | ICD-10-CM | POA: Diagnosis not present

## 2015-03-26 DIAGNOSIS — M62551 Muscle wasting and atrophy, not elsewhere classified, right thigh: Secondary | ICD-10-CM | POA: Diagnosis not present

## 2015-03-28 DIAGNOSIS — R262 Difficulty in walking, not elsewhere classified: Secondary | ICD-10-CM | POA: Diagnosis not present

## 2015-03-28 DIAGNOSIS — M25561 Pain in right knee: Secondary | ICD-10-CM | POA: Diagnosis not present

## 2015-03-28 DIAGNOSIS — Z96651 Presence of right artificial knee joint: Secondary | ICD-10-CM | POA: Diagnosis not present

## 2015-03-28 DIAGNOSIS — M62551 Muscle wasting and atrophy, not elsewhere classified, right thigh: Secondary | ICD-10-CM | POA: Diagnosis not present

## 2015-03-30 DIAGNOSIS — Z96651 Presence of right artificial knee joint: Secondary | ICD-10-CM | POA: Diagnosis not present

## 2015-03-30 DIAGNOSIS — R262 Difficulty in walking, not elsewhere classified: Secondary | ICD-10-CM | POA: Diagnosis not present

## 2015-03-30 DIAGNOSIS — M25561 Pain in right knee: Secondary | ICD-10-CM | POA: Diagnosis not present

## 2015-03-30 DIAGNOSIS — M62551 Muscle wasting and atrophy, not elsewhere classified, right thigh: Secondary | ICD-10-CM | POA: Diagnosis not present

## 2015-04-02 DIAGNOSIS — R262 Difficulty in walking, not elsewhere classified: Secondary | ICD-10-CM | POA: Diagnosis not present

## 2015-04-02 DIAGNOSIS — M62551 Muscle wasting and atrophy, not elsewhere classified, right thigh: Secondary | ICD-10-CM | POA: Diagnosis not present

## 2015-04-02 DIAGNOSIS — Z96651 Presence of right artificial knee joint: Secondary | ICD-10-CM | POA: Diagnosis not present

## 2015-04-02 DIAGNOSIS — M25561 Pain in right knee: Secondary | ICD-10-CM | POA: Diagnosis not present

## 2015-04-04 DIAGNOSIS — Z96651 Presence of right artificial knee joint: Secondary | ICD-10-CM | POA: Diagnosis not present

## 2015-04-04 DIAGNOSIS — M62551 Muscle wasting and atrophy, not elsewhere classified, right thigh: Secondary | ICD-10-CM | POA: Diagnosis not present

## 2015-04-04 DIAGNOSIS — M25561 Pain in right knee: Secondary | ICD-10-CM | POA: Diagnosis not present

## 2015-04-04 DIAGNOSIS — R262 Difficulty in walking, not elsewhere classified: Secondary | ICD-10-CM | POA: Diagnosis not present

## 2015-04-05 DIAGNOSIS — Z96651 Presence of right artificial knee joint: Secondary | ICD-10-CM | POA: Diagnosis not present

## 2015-04-05 DIAGNOSIS — R262 Difficulty in walking, not elsewhere classified: Secondary | ICD-10-CM | POA: Diagnosis not present

## 2015-04-05 DIAGNOSIS — M25561 Pain in right knee: Secondary | ICD-10-CM | POA: Diagnosis not present

## 2015-04-05 DIAGNOSIS — M62551 Muscle wasting and atrophy, not elsewhere classified, right thigh: Secondary | ICD-10-CM | POA: Diagnosis not present

## 2015-04-06 DIAGNOSIS — Z96652 Presence of left artificial knee joint: Secondary | ICD-10-CM | POA: Diagnosis not present

## 2015-04-06 DIAGNOSIS — Z471 Aftercare following joint replacement surgery: Secondary | ICD-10-CM | POA: Diagnosis not present

## 2015-04-09 DIAGNOSIS — Z96651 Presence of right artificial knee joint: Secondary | ICD-10-CM | POA: Diagnosis not present

## 2015-04-09 DIAGNOSIS — R262 Difficulty in walking, not elsewhere classified: Secondary | ICD-10-CM | POA: Diagnosis not present

## 2015-04-09 DIAGNOSIS — M62551 Muscle wasting and atrophy, not elsewhere classified, right thigh: Secondary | ICD-10-CM | POA: Diagnosis not present

## 2015-04-09 DIAGNOSIS — M25561 Pain in right knee: Secondary | ICD-10-CM | POA: Diagnosis not present

## 2015-04-12 DIAGNOSIS — M62551 Muscle wasting and atrophy, not elsewhere classified, right thigh: Secondary | ICD-10-CM | POA: Diagnosis not present

## 2015-04-12 DIAGNOSIS — R262 Difficulty in walking, not elsewhere classified: Secondary | ICD-10-CM | POA: Diagnosis not present

## 2015-04-12 DIAGNOSIS — M25561 Pain in right knee: Secondary | ICD-10-CM | POA: Diagnosis not present

## 2015-04-12 DIAGNOSIS — Z96651 Presence of right artificial knee joint: Secondary | ICD-10-CM | POA: Diagnosis not present

## 2015-04-17 DIAGNOSIS — M25561 Pain in right knee: Secondary | ICD-10-CM | POA: Diagnosis not present

## 2015-04-17 DIAGNOSIS — M62551 Muscle wasting and atrophy, not elsewhere classified, right thigh: Secondary | ICD-10-CM | POA: Diagnosis not present

## 2015-04-17 DIAGNOSIS — Z96651 Presence of right artificial knee joint: Secondary | ICD-10-CM | POA: Diagnosis not present

## 2015-04-17 DIAGNOSIS — R262 Difficulty in walking, not elsewhere classified: Secondary | ICD-10-CM | POA: Diagnosis not present

## 2015-04-20 DIAGNOSIS — M25561 Pain in right knee: Secondary | ICD-10-CM | POA: Diagnosis not present

## 2015-04-20 DIAGNOSIS — M62551 Muscle wasting and atrophy, not elsewhere classified, right thigh: Secondary | ICD-10-CM | POA: Diagnosis not present

## 2015-04-20 DIAGNOSIS — Z96651 Presence of right artificial knee joint: Secondary | ICD-10-CM | POA: Diagnosis not present

## 2015-04-20 DIAGNOSIS — R262 Difficulty in walking, not elsewhere classified: Secondary | ICD-10-CM | POA: Diagnosis not present

## 2015-04-24 DIAGNOSIS — R262 Difficulty in walking, not elsewhere classified: Secondary | ICD-10-CM | POA: Diagnosis not present

## 2015-04-24 DIAGNOSIS — M62551 Muscle wasting and atrophy, not elsewhere classified, right thigh: Secondary | ICD-10-CM | POA: Diagnosis not present

## 2015-04-24 DIAGNOSIS — M25561 Pain in right knee: Secondary | ICD-10-CM | POA: Diagnosis not present

## 2015-04-24 DIAGNOSIS — Z96651 Presence of right artificial knee joint: Secondary | ICD-10-CM | POA: Diagnosis not present

## 2015-04-26 DIAGNOSIS — Z96651 Presence of right artificial knee joint: Secondary | ICD-10-CM | POA: Diagnosis not present

## 2015-04-26 DIAGNOSIS — R262 Difficulty in walking, not elsewhere classified: Secondary | ICD-10-CM | POA: Diagnosis not present

## 2015-04-26 DIAGNOSIS — M62551 Muscle wasting and atrophy, not elsewhere classified, right thigh: Secondary | ICD-10-CM | POA: Diagnosis not present

## 2015-04-26 DIAGNOSIS — M25561 Pain in right knee: Secondary | ICD-10-CM | POA: Diagnosis not present

## 2015-04-30 DIAGNOSIS — R262 Difficulty in walking, not elsewhere classified: Secondary | ICD-10-CM | POA: Diagnosis not present

## 2015-04-30 DIAGNOSIS — M25561 Pain in right knee: Secondary | ICD-10-CM | POA: Diagnosis not present

## 2015-04-30 DIAGNOSIS — M62551 Muscle wasting and atrophy, not elsewhere classified, right thigh: Secondary | ICD-10-CM | POA: Diagnosis not present

## 2015-04-30 DIAGNOSIS — Z96651 Presence of right artificial knee joint: Secondary | ICD-10-CM | POA: Diagnosis not present

## 2015-05-04 DIAGNOSIS — M62551 Muscle wasting and atrophy, not elsewhere classified, right thigh: Secondary | ICD-10-CM | POA: Diagnosis not present

## 2015-05-04 DIAGNOSIS — M25561 Pain in right knee: Secondary | ICD-10-CM | POA: Diagnosis not present

## 2015-05-04 DIAGNOSIS — Z96651 Presence of right artificial knee joint: Secondary | ICD-10-CM | POA: Diagnosis not present

## 2015-05-04 DIAGNOSIS — R262 Difficulty in walking, not elsewhere classified: Secondary | ICD-10-CM | POA: Diagnosis not present

## 2015-05-11 DIAGNOSIS — Z471 Aftercare following joint replacement surgery: Secondary | ICD-10-CM | POA: Diagnosis not present

## 2015-05-11 DIAGNOSIS — Z96652 Presence of left artificial knee joint: Secondary | ICD-10-CM | POA: Diagnosis not present

## 2015-05-15 DIAGNOSIS — Z96651 Presence of right artificial knee joint: Secondary | ICD-10-CM | POA: Diagnosis not present

## 2015-05-15 DIAGNOSIS — M62551 Muscle wasting and atrophy, not elsewhere classified, right thigh: Secondary | ICD-10-CM | POA: Diagnosis not present

## 2015-05-15 DIAGNOSIS — M25561 Pain in right knee: Secondary | ICD-10-CM | POA: Diagnosis not present

## 2015-05-15 DIAGNOSIS — R262 Difficulty in walking, not elsewhere classified: Secondary | ICD-10-CM | POA: Diagnosis not present

## 2015-05-18 DIAGNOSIS — R262 Difficulty in walking, not elsewhere classified: Secondary | ICD-10-CM | POA: Diagnosis not present

## 2015-05-18 DIAGNOSIS — M25561 Pain in right knee: Secondary | ICD-10-CM | POA: Diagnosis not present

## 2015-05-18 DIAGNOSIS — Z96651 Presence of right artificial knee joint: Secondary | ICD-10-CM | POA: Diagnosis not present

## 2015-05-18 DIAGNOSIS — M62551 Muscle wasting and atrophy, not elsewhere classified, right thigh: Secondary | ICD-10-CM | POA: Diagnosis not present

## 2015-05-21 DIAGNOSIS — M62551 Muscle wasting and atrophy, not elsewhere classified, right thigh: Secondary | ICD-10-CM | POA: Diagnosis not present

## 2015-05-21 DIAGNOSIS — M25561 Pain in right knee: Secondary | ICD-10-CM | POA: Diagnosis not present

## 2015-05-21 DIAGNOSIS — Z96651 Presence of right artificial knee joint: Secondary | ICD-10-CM | POA: Diagnosis not present

## 2015-05-21 DIAGNOSIS — R262 Difficulty in walking, not elsewhere classified: Secondary | ICD-10-CM | POA: Diagnosis not present

## 2015-05-23 DIAGNOSIS — M62551 Muscle wasting and atrophy, not elsewhere classified, right thigh: Secondary | ICD-10-CM | POA: Diagnosis not present

## 2015-05-23 DIAGNOSIS — M7541 Impingement syndrome of right shoulder: Secondary | ICD-10-CM | POA: Diagnosis not present

## 2015-05-23 DIAGNOSIS — M25511 Pain in right shoulder: Secondary | ICD-10-CM | POA: Diagnosis not present

## 2015-05-23 DIAGNOSIS — M25561 Pain in right knee: Secondary | ICD-10-CM | POA: Diagnosis not present

## 2015-05-23 DIAGNOSIS — R262 Difficulty in walking, not elsewhere classified: Secondary | ICD-10-CM | POA: Diagnosis not present

## 2015-05-23 DIAGNOSIS — Z96651 Presence of right artificial knee joint: Secondary | ICD-10-CM | POA: Diagnosis not present

## 2015-05-29 DIAGNOSIS — M62551 Muscle wasting and atrophy, not elsewhere classified, right thigh: Secondary | ICD-10-CM | POA: Diagnosis not present

## 2015-05-29 DIAGNOSIS — M25561 Pain in right knee: Secondary | ICD-10-CM | POA: Diagnosis not present

## 2015-05-29 DIAGNOSIS — Z96651 Presence of right artificial knee joint: Secondary | ICD-10-CM | POA: Diagnosis not present

## 2015-05-29 DIAGNOSIS — R262 Difficulty in walking, not elsewhere classified: Secondary | ICD-10-CM | POA: Diagnosis not present

## 2015-05-31 DIAGNOSIS — M25511 Pain in right shoulder: Secondary | ICD-10-CM | POA: Diagnosis not present

## 2015-05-31 DIAGNOSIS — M25561 Pain in right knee: Secondary | ICD-10-CM | POA: Diagnosis not present

## 2015-05-31 DIAGNOSIS — R262 Difficulty in walking, not elsewhere classified: Secondary | ICD-10-CM | POA: Diagnosis not present

## 2015-05-31 DIAGNOSIS — M19011 Primary osteoarthritis, right shoulder: Secondary | ICD-10-CM | POA: Diagnosis not present

## 2015-05-31 DIAGNOSIS — M75101 Unspecified rotator cuff tear or rupture of right shoulder, not specified as traumatic: Secondary | ICD-10-CM | POA: Diagnosis not present

## 2015-05-31 DIAGNOSIS — G8929 Other chronic pain: Secondary | ICD-10-CM | POA: Diagnosis not present

## 2015-05-31 DIAGNOSIS — Z96651 Presence of right artificial knee joint: Secondary | ICD-10-CM | POA: Diagnosis not present

## 2015-05-31 DIAGNOSIS — M62551 Muscle wasting and atrophy, not elsewhere classified, right thigh: Secondary | ICD-10-CM | POA: Diagnosis not present

## 2015-05-31 DIAGNOSIS — M7581 Other shoulder lesions, right shoulder: Secondary | ICD-10-CM | POA: Diagnosis not present

## 2015-06-01 DIAGNOSIS — M75121 Complete rotator cuff tear or rupture of right shoulder, not specified as traumatic: Secondary | ICD-10-CM | POA: Diagnosis not present

## 2015-06-05 DIAGNOSIS — M25561 Pain in right knee: Secondary | ICD-10-CM | POA: Diagnosis not present

## 2015-06-05 DIAGNOSIS — R262 Difficulty in walking, not elsewhere classified: Secondary | ICD-10-CM | POA: Diagnosis not present

## 2015-06-05 DIAGNOSIS — Z96651 Presence of right artificial knee joint: Secondary | ICD-10-CM | POA: Diagnosis not present

## 2015-06-05 DIAGNOSIS — M62551 Muscle wasting and atrophy, not elsewhere classified, right thigh: Secondary | ICD-10-CM | POA: Diagnosis not present

## 2015-06-07 DIAGNOSIS — M62551 Muscle wasting and atrophy, not elsewhere classified, right thigh: Secondary | ICD-10-CM | POA: Diagnosis not present

## 2015-06-07 DIAGNOSIS — R262 Difficulty in walking, not elsewhere classified: Secondary | ICD-10-CM | POA: Diagnosis not present

## 2015-06-07 DIAGNOSIS — M25561 Pain in right knee: Secondary | ICD-10-CM | POA: Diagnosis not present

## 2015-06-07 DIAGNOSIS — Z96651 Presence of right artificial knee joint: Secondary | ICD-10-CM | POA: Diagnosis not present

## 2015-06-12 DIAGNOSIS — R262 Difficulty in walking, not elsewhere classified: Secondary | ICD-10-CM | POA: Diagnosis not present

## 2015-06-12 DIAGNOSIS — Z96651 Presence of right artificial knee joint: Secondary | ICD-10-CM | POA: Diagnosis not present

## 2015-06-12 DIAGNOSIS — M62551 Muscle wasting and atrophy, not elsewhere classified, right thigh: Secondary | ICD-10-CM | POA: Diagnosis not present

## 2015-06-12 DIAGNOSIS — M25561 Pain in right knee: Secondary | ICD-10-CM | POA: Diagnosis not present

## 2015-06-14 DIAGNOSIS — Z471 Aftercare following joint replacement surgery: Secondary | ICD-10-CM | POA: Diagnosis not present

## 2015-06-14 DIAGNOSIS — Z96652 Presence of left artificial knee joint: Secondary | ICD-10-CM | POA: Diagnosis not present

## 2015-07-20 DIAGNOSIS — R5383 Other fatigue: Secondary | ICD-10-CM | POA: Diagnosis not present

## 2015-07-20 DIAGNOSIS — E785 Hyperlipidemia, unspecified: Secondary | ICD-10-CM | POA: Diagnosis not present

## 2015-07-20 DIAGNOSIS — Z79899 Other long term (current) drug therapy: Secondary | ICD-10-CM | POA: Diagnosis not present

## 2015-07-20 DIAGNOSIS — Z1231 Encounter for screening mammogram for malignant neoplasm of breast: Secondary | ICD-10-CM | POA: Diagnosis not present

## 2015-07-20 DIAGNOSIS — Z0000 Encounter for general adult medical examination without abnormal findings: Secondary | ICD-10-CM | POA: Diagnosis not present

## 2015-07-20 DIAGNOSIS — Z1389 Encounter for screening for other disorder: Secondary | ICD-10-CM | POA: Diagnosis not present

## 2015-08-09 DIAGNOSIS — Z1231 Encounter for screening mammogram for malignant neoplasm of breast: Secondary | ICD-10-CM | POA: Diagnosis not present

## 2015-09-03 DIAGNOSIS — M75121 Complete rotator cuff tear or rupture of right shoulder, not specified as traumatic: Secondary | ICD-10-CM | POA: Diagnosis not present

## 2015-09-06 DIAGNOSIS — M25511 Pain in right shoulder: Secondary | ICD-10-CM | POA: Diagnosis not present

## 2015-09-06 DIAGNOSIS — M1990 Unspecified osteoarthritis, unspecified site: Secondary | ICD-10-CM | POA: Diagnosis not present

## 2015-09-06 DIAGNOSIS — Z79899 Other long term (current) drug therapy: Secondary | ICD-10-CM | POA: Diagnosis not present

## 2015-09-06 DIAGNOSIS — E039 Hypothyroidism, unspecified: Secondary | ICD-10-CM | POA: Diagnosis not present

## 2015-09-06 DIAGNOSIS — G8918 Other acute postprocedural pain: Secondary | ICD-10-CM | POA: Diagnosis not present

## 2015-09-06 DIAGNOSIS — E7800 Pure hypercholesterolemia, unspecified: Secondary | ICD-10-CM | POA: Diagnosis not present

## 2015-09-06 DIAGNOSIS — M65811 Other synovitis and tenosynovitis, right shoulder: Secondary | ICD-10-CM | POA: Diagnosis not present

## 2015-09-06 DIAGNOSIS — E559 Vitamin D deficiency, unspecified: Secondary | ICD-10-CM | POA: Diagnosis not present

## 2015-09-06 DIAGNOSIS — E785 Hyperlipidemia, unspecified: Secondary | ICD-10-CM | POA: Diagnosis not present

## 2015-09-06 DIAGNOSIS — M75121 Complete rotator cuff tear or rupture of right shoulder, not specified as traumatic: Secondary | ICD-10-CM | POA: Diagnosis not present

## 2015-09-06 DIAGNOSIS — K219 Gastro-esophageal reflux disease without esophagitis: Secondary | ICD-10-CM | POA: Diagnosis not present

## 2015-09-06 DIAGNOSIS — M7541 Impingement syndrome of right shoulder: Secondary | ICD-10-CM | POA: Diagnosis not present

## 2015-09-10 DIAGNOSIS — M75101 Unspecified rotator cuff tear or rupture of right shoulder, not specified as traumatic: Secondary | ICD-10-CM | POA: Diagnosis not present

## 2015-09-10 DIAGNOSIS — M25511 Pain in right shoulder: Secondary | ICD-10-CM | POA: Diagnosis not present

## 2015-09-10 DIAGNOSIS — M62511 Muscle wasting and atrophy, not elsewhere classified, right shoulder: Secondary | ICD-10-CM | POA: Diagnosis not present

## 2015-09-12 DIAGNOSIS — M25511 Pain in right shoulder: Secondary | ICD-10-CM | POA: Diagnosis not present

## 2015-09-12 DIAGNOSIS — M62511 Muscle wasting and atrophy, not elsewhere classified, right shoulder: Secondary | ICD-10-CM | POA: Diagnosis not present

## 2015-09-12 DIAGNOSIS — M75101 Unspecified rotator cuff tear or rupture of right shoulder, not specified as traumatic: Secondary | ICD-10-CM | POA: Diagnosis not present

## 2015-09-14 DIAGNOSIS — M62511 Muscle wasting and atrophy, not elsewhere classified, right shoulder: Secondary | ICD-10-CM | POA: Diagnosis not present

## 2015-09-14 DIAGNOSIS — M75101 Unspecified rotator cuff tear or rupture of right shoulder, not specified as traumatic: Secondary | ICD-10-CM | POA: Diagnosis not present

## 2015-09-14 DIAGNOSIS — M25511 Pain in right shoulder: Secondary | ICD-10-CM | POA: Diagnosis not present

## 2015-09-17 DIAGNOSIS — M25511 Pain in right shoulder: Secondary | ICD-10-CM | POA: Diagnosis not present

## 2015-09-17 DIAGNOSIS — M75101 Unspecified rotator cuff tear or rupture of right shoulder, not specified as traumatic: Secondary | ICD-10-CM | POA: Diagnosis not present

## 2015-09-17 DIAGNOSIS — M62511 Muscle wasting and atrophy, not elsewhere classified, right shoulder: Secondary | ICD-10-CM | POA: Diagnosis not present

## 2015-09-19 DIAGNOSIS — M75101 Unspecified rotator cuff tear or rupture of right shoulder, not specified as traumatic: Secondary | ICD-10-CM | POA: Diagnosis not present

## 2015-09-19 DIAGNOSIS — M62511 Muscle wasting and atrophy, not elsewhere classified, right shoulder: Secondary | ICD-10-CM | POA: Diagnosis not present

## 2015-09-19 DIAGNOSIS — M25511 Pain in right shoulder: Secondary | ICD-10-CM | POA: Diagnosis not present

## 2015-09-21 DIAGNOSIS — M75101 Unspecified rotator cuff tear or rupture of right shoulder, not specified as traumatic: Secondary | ICD-10-CM | POA: Diagnosis not present

## 2015-09-21 DIAGNOSIS — M25511 Pain in right shoulder: Secondary | ICD-10-CM | POA: Diagnosis not present

## 2015-09-21 DIAGNOSIS — M62511 Muscle wasting and atrophy, not elsewhere classified, right shoulder: Secondary | ICD-10-CM | POA: Diagnosis not present

## 2015-09-24 DIAGNOSIS — M75101 Unspecified rotator cuff tear or rupture of right shoulder, not specified as traumatic: Secondary | ICD-10-CM | POA: Diagnosis not present

## 2015-09-24 DIAGNOSIS — M25511 Pain in right shoulder: Secondary | ICD-10-CM | POA: Diagnosis not present

## 2015-09-24 DIAGNOSIS — M62511 Muscle wasting and atrophy, not elsewhere classified, right shoulder: Secondary | ICD-10-CM | POA: Diagnosis not present

## 2015-09-26 DIAGNOSIS — M62511 Muscle wasting and atrophy, not elsewhere classified, right shoulder: Secondary | ICD-10-CM | POA: Diagnosis not present

## 2015-09-26 DIAGNOSIS — M25511 Pain in right shoulder: Secondary | ICD-10-CM | POA: Diagnosis not present

## 2015-09-26 DIAGNOSIS — M75101 Unspecified rotator cuff tear or rupture of right shoulder, not specified as traumatic: Secondary | ICD-10-CM | POA: Diagnosis not present

## 2015-09-28 DIAGNOSIS — M25511 Pain in right shoulder: Secondary | ICD-10-CM | POA: Diagnosis not present

## 2015-09-28 DIAGNOSIS — M62511 Muscle wasting and atrophy, not elsewhere classified, right shoulder: Secondary | ICD-10-CM | POA: Diagnosis not present

## 2015-09-28 DIAGNOSIS — M75101 Unspecified rotator cuff tear or rupture of right shoulder, not specified as traumatic: Secondary | ICD-10-CM | POA: Diagnosis not present

## 2015-10-01 DIAGNOSIS — M62511 Muscle wasting and atrophy, not elsewhere classified, right shoulder: Secondary | ICD-10-CM | POA: Diagnosis not present

## 2015-10-01 DIAGNOSIS — M25511 Pain in right shoulder: Secondary | ICD-10-CM | POA: Diagnosis not present

## 2015-10-01 DIAGNOSIS — M75101 Unspecified rotator cuff tear or rupture of right shoulder, not specified as traumatic: Secondary | ICD-10-CM | POA: Diagnosis not present

## 2015-10-03 DIAGNOSIS — M25511 Pain in right shoulder: Secondary | ICD-10-CM | POA: Diagnosis not present

## 2015-10-03 DIAGNOSIS — M62511 Muscle wasting and atrophy, not elsewhere classified, right shoulder: Secondary | ICD-10-CM | POA: Diagnosis not present

## 2015-10-03 DIAGNOSIS — M75101 Unspecified rotator cuff tear or rupture of right shoulder, not specified as traumatic: Secondary | ICD-10-CM | POA: Diagnosis not present

## 2015-10-05 DIAGNOSIS — M62511 Muscle wasting and atrophy, not elsewhere classified, right shoulder: Secondary | ICD-10-CM | POA: Diagnosis not present

## 2015-10-05 DIAGNOSIS — M75101 Unspecified rotator cuff tear or rupture of right shoulder, not specified as traumatic: Secondary | ICD-10-CM | POA: Diagnosis not present

## 2015-10-05 DIAGNOSIS — M25511 Pain in right shoulder: Secondary | ICD-10-CM | POA: Diagnosis not present

## 2015-10-08 DIAGNOSIS — M62511 Muscle wasting and atrophy, not elsewhere classified, right shoulder: Secondary | ICD-10-CM | POA: Diagnosis not present

## 2015-10-08 DIAGNOSIS — M75101 Unspecified rotator cuff tear or rupture of right shoulder, not specified as traumatic: Secondary | ICD-10-CM | POA: Diagnosis not present

## 2015-10-08 DIAGNOSIS — M25511 Pain in right shoulder: Secondary | ICD-10-CM | POA: Diagnosis not present

## 2015-10-10 DIAGNOSIS — M62511 Muscle wasting and atrophy, not elsewhere classified, right shoulder: Secondary | ICD-10-CM | POA: Diagnosis not present

## 2015-10-10 DIAGNOSIS — M25511 Pain in right shoulder: Secondary | ICD-10-CM | POA: Diagnosis not present

## 2015-10-10 DIAGNOSIS — M75101 Unspecified rotator cuff tear or rupture of right shoulder, not specified as traumatic: Secondary | ICD-10-CM | POA: Diagnosis not present

## 2015-10-12 DIAGNOSIS — M62511 Muscle wasting and atrophy, not elsewhere classified, right shoulder: Secondary | ICD-10-CM | POA: Diagnosis not present

## 2015-10-12 DIAGNOSIS — M25511 Pain in right shoulder: Secondary | ICD-10-CM | POA: Diagnosis not present

## 2015-10-12 DIAGNOSIS — M75101 Unspecified rotator cuff tear or rupture of right shoulder, not specified as traumatic: Secondary | ICD-10-CM | POA: Diagnosis not present

## 2015-10-15 DIAGNOSIS — M25511 Pain in right shoulder: Secondary | ICD-10-CM | POA: Diagnosis not present

## 2015-10-15 DIAGNOSIS — M62511 Muscle wasting and atrophy, not elsewhere classified, right shoulder: Secondary | ICD-10-CM | POA: Diagnosis not present

## 2015-10-15 DIAGNOSIS — M75101 Unspecified rotator cuff tear or rupture of right shoulder, not specified as traumatic: Secondary | ICD-10-CM | POA: Diagnosis not present

## 2015-10-17 DIAGNOSIS — M25511 Pain in right shoulder: Secondary | ICD-10-CM | POA: Diagnosis not present

## 2015-10-17 DIAGNOSIS — M62511 Muscle wasting and atrophy, not elsewhere classified, right shoulder: Secondary | ICD-10-CM | POA: Diagnosis not present

## 2015-10-17 DIAGNOSIS — Z9889 Other specified postprocedural states: Secondary | ICD-10-CM | POA: Diagnosis not present

## 2015-10-17 DIAGNOSIS — M75121 Complete rotator cuff tear or rupture of right shoulder, not specified as traumatic: Secondary | ICD-10-CM | POA: Diagnosis not present

## 2015-10-17 DIAGNOSIS — M75101 Unspecified rotator cuff tear or rupture of right shoulder, not specified as traumatic: Secondary | ICD-10-CM | POA: Diagnosis not present

## 2015-10-19 DIAGNOSIS — M75101 Unspecified rotator cuff tear or rupture of right shoulder, not specified as traumatic: Secondary | ICD-10-CM | POA: Diagnosis not present

## 2015-10-19 DIAGNOSIS — M62511 Muscle wasting and atrophy, not elsewhere classified, right shoulder: Secondary | ICD-10-CM | POA: Diagnosis not present

## 2015-10-19 DIAGNOSIS — M25511 Pain in right shoulder: Secondary | ICD-10-CM | POA: Diagnosis not present

## 2015-10-22 DIAGNOSIS — M62511 Muscle wasting and atrophy, not elsewhere classified, right shoulder: Secondary | ICD-10-CM | POA: Diagnosis not present

## 2015-10-22 DIAGNOSIS — M25511 Pain in right shoulder: Secondary | ICD-10-CM | POA: Diagnosis not present

## 2015-10-22 DIAGNOSIS — M75101 Unspecified rotator cuff tear or rupture of right shoulder, not specified as traumatic: Secondary | ICD-10-CM | POA: Diagnosis not present

## 2015-10-24 DIAGNOSIS — M25511 Pain in right shoulder: Secondary | ICD-10-CM | POA: Diagnosis not present

## 2015-10-24 DIAGNOSIS — M75101 Unspecified rotator cuff tear or rupture of right shoulder, not specified as traumatic: Secondary | ICD-10-CM | POA: Diagnosis not present

## 2015-10-24 DIAGNOSIS — M62511 Muscle wasting and atrophy, not elsewhere classified, right shoulder: Secondary | ICD-10-CM | POA: Diagnosis not present

## 2015-10-25 DIAGNOSIS — M62511 Muscle wasting and atrophy, not elsewhere classified, right shoulder: Secondary | ICD-10-CM | POA: Diagnosis not present

## 2015-10-25 DIAGNOSIS — M75101 Unspecified rotator cuff tear or rupture of right shoulder, not specified as traumatic: Secondary | ICD-10-CM | POA: Diagnosis not present

## 2015-10-25 DIAGNOSIS — M25511 Pain in right shoulder: Secondary | ICD-10-CM | POA: Diagnosis not present

## 2015-10-29 DIAGNOSIS — M62511 Muscle wasting and atrophy, not elsewhere classified, right shoulder: Secondary | ICD-10-CM | POA: Diagnosis not present

## 2015-10-29 DIAGNOSIS — M75101 Unspecified rotator cuff tear or rupture of right shoulder, not specified as traumatic: Secondary | ICD-10-CM | POA: Diagnosis not present

## 2015-10-29 DIAGNOSIS — M25511 Pain in right shoulder: Secondary | ICD-10-CM | POA: Diagnosis not present

## 2015-10-31 DIAGNOSIS — M25511 Pain in right shoulder: Secondary | ICD-10-CM | POA: Diagnosis not present

## 2015-10-31 DIAGNOSIS — M75101 Unspecified rotator cuff tear or rupture of right shoulder, not specified as traumatic: Secondary | ICD-10-CM | POA: Diagnosis not present

## 2015-10-31 DIAGNOSIS — M62511 Muscle wasting and atrophy, not elsewhere classified, right shoulder: Secondary | ICD-10-CM | POA: Diagnosis not present

## 2015-11-02 DIAGNOSIS — M75101 Unspecified rotator cuff tear or rupture of right shoulder, not specified as traumatic: Secondary | ICD-10-CM | POA: Diagnosis not present

## 2015-11-02 DIAGNOSIS — M62511 Muscle wasting and atrophy, not elsewhere classified, right shoulder: Secondary | ICD-10-CM | POA: Diagnosis not present

## 2015-11-02 DIAGNOSIS — M25511 Pain in right shoulder: Secondary | ICD-10-CM | POA: Diagnosis not present

## 2015-11-05 DIAGNOSIS — M62511 Muscle wasting and atrophy, not elsewhere classified, right shoulder: Secondary | ICD-10-CM | POA: Diagnosis not present

## 2015-11-05 DIAGNOSIS — M75101 Unspecified rotator cuff tear or rupture of right shoulder, not specified as traumatic: Secondary | ICD-10-CM | POA: Diagnosis not present

## 2015-11-05 DIAGNOSIS — M25511 Pain in right shoulder: Secondary | ICD-10-CM | POA: Diagnosis not present

## 2015-11-07 DIAGNOSIS — M75101 Unspecified rotator cuff tear or rupture of right shoulder, not specified as traumatic: Secondary | ICD-10-CM | POA: Diagnosis not present

## 2015-11-07 DIAGNOSIS — M62511 Muscle wasting and atrophy, not elsewhere classified, right shoulder: Secondary | ICD-10-CM | POA: Diagnosis not present

## 2015-11-07 DIAGNOSIS — M25511 Pain in right shoulder: Secondary | ICD-10-CM | POA: Diagnosis not present

## 2015-11-13 DIAGNOSIS — M25511 Pain in right shoulder: Secondary | ICD-10-CM | POA: Diagnosis not present

## 2015-11-13 DIAGNOSIS — M75101 Unspecified rotator cuff tear or rupture of right shoulder, not specified as traumatic: Secondary | ICD-10-CM | POA: Diagnosis not present

## 2015-11-13 DIAGNOSIS — M62511 Muscle wasting and atrophy, not elsewhere classified, right shoulder: Secondary | ICD-10-CM | POA: Diagnosis not present

## 2015-11-16 DIAGNOSIS — M25511 Pain in right shoulder: Secondary | ICD-10-CM | POA: Diagnosis not present

## 2015-11-16 DIAGNOSIS — M62511 Muscle wasting and atrophy, not elsewhere classified, right shoulder: Secondary | ICD-10-CM | POA: Diagnosis not present

## 2015-11-16 DIAGNOSIS — M75101 Unspecified rotator cuff tear or rupture of right shoulder, not specified as traumatic: Secondary | ICD-10-CM | POA: Diagnosis not present

## 2015-11-19 DIAGNOSIS — M75101 Unspecified rotator cuff tear or rupture of right shoulder, not specified as traumatic: Secondary | ICD-10-CM | POA: Diagnosis not present

## 2015-11-19 DIAGNOSIS — M62511 Muscle wasting and atrophy, not elsewhere classified, right shoulder: Secondary | ICD-10-CM | POA: Diagnosis not present

## 2015-11-19 DIAGNOSIS — M25511 Pain in right shoulder: Secondary | ICD-10-CM | POA: Diagnosis not present

## 2015-11-22 DIAGNOSIS — M25511 Pain in right shoulder: Secondary | ICD-10-CM | POA: Diagnosis not present

## 2015-11-22 DIAGNOSIS — M75101 Unspecified rotator cuff tear or rupture of right shoulder, not specified as traumatic: Secondary | ICD-10-CM | POA: Diagnosis not present

## 2015-11-22 DIAGNOSIS — M62511 Muscle wasting and atrophy, not elsewhere classified, right shoulder: Secondary | ICD-10-CM | POA: Diagnosis not present

## 2015-11-27 DIAGNOSIS — M62511 Muscle wasting and atrophy, not elsewhere classified, right shoulder: Secondary | ICD-10-CM | POA: Diagnosis not present

## 2015-11-27 DIAGNOSIS — M25511 Pain in right shoulder: Secondary | ICD-10-CM | POA: Diagnosis not present

## 2015-11-27 DIAGNOSIS — M75101 Unspecified rotator cuff tear or rupture of right shoulder, not specified as traumatic: Secondary | ICD-10-CM | POA: Diagnosis not present

## 2015-11-29 DIAGNOSIS — M75101 Unspecified rotator cuff tear or rupture of right shoulder, not specified as traumatic: Secondary | ICD-10-CM | POA: Diagnosis not present

## 2015-11-29 DIAGNOSIS — M62511 Muscle wasting and atrophy, not elsewhere classified, right shoulder: Secondary | ICD-10-CM | POA: Diagnosis not present

## 2015-11-29 DIAGNOSIS — M25511 Pain in right shoulder: Secondary | ICD-10-CM | POA: Diagnosis not present

## 2015-12-21 DIAGNOSIS — M75101 Unspecified rotator cuff tear or rupture of right shoulder, not specified as traumatic: Secondary | ICD-10-CM | POA: Diagnosis not present

## 2015-12-21 DIAGNOSIS — M62511 Muscle wasting and atrophy, not elsewhere classified, right shoulder: Secondary | ICD-10-CM | POA: Diagnosis not present

## 2015-12-21 DIAGNOSIS — M25511 Pain in right shoulder: Secondary | ICD-10-CM | POA: Diagnosis not present

## 2015-12-24 DIAGNOSIS — M25511 Pain in right shoulder: Secondary | ICD-10-CM | POA: Diagnosis not present

## 2015-12-24 DIAGNOSIS — M62511 Muscle wasting and atrophy, not elsewhere classified, right shoulder: Secondary | ICD-10-CM | POA: Diagnosis not present

## 2015-12-24 DIAGNOSIS — M75101 Unspecified rotator cuff tear or rupture of right shoulder, not specified as traumatic: Secondary | ICD-10-CM | POA: Diagnosis not present

## 2015-12-25 DIAGNOSIS — M75101 Unspecified rotator cuff tear or rupture of right shoulder, not specified as traumatic: Secondary | ICD-10-CM | POA: Diagnosis not present

## 2015-12-25 DIAGNOSIS — M25511 Pain in right shoulder: Secondary | ICD-10-CM | POA: Diagnosis not present

## 2015-12-25 DIAGNOSIS — M62511 Muscle wasting and atrophy, not elsewhere classified, right shoulder: Secondary | ICD-10-CM | POA: Diagnosis not present

## 2015-12-26 DIAGNOSIS — M75121 Complete rotator cuff tear or rupture of right shoulder, not specified as traumatic: Secondary | ICD-10-CM | POA: Diagnosis not present

## 2015-12-26 DIAGNOSIS — Z9889 Other specified postprocedural states: Secondary | ICD-10-CM | POA: Diagnosis not present

## 2016-01-18 DIAGNOSIS — M542 Cervicalgia: Secondary | ICD-10-CM | POA: Diagnosis not present

## 2016-01-18 DIAGNOSIS — E039 Hypothyroidism, unspecified: Secondary | ICD-10-CM | POA: Diagnosis not present

## 2016-01-18 DIAGNOSIS — K5900 Constipation, unspecified: Secondary | ICD-10-CM | POA: Diagnosis not present

## 2016-01-18 DIAGNOSIS — E785 Hyperlipidemia, unspecified: Secondary | ICD-10-CM | POA: Diagnosis not present

## 2016-01-18 DIAGNOSIS — Z79899 Other long term (current) drug therapy: Secondary | ICD-10-CM | POA: Diagnosis not present

## 2016-03-04 DIAGNOSIS — Z96652 Presence of left artificial knee joint: Secondary | ICD-10-CM | POA: Diagnosis not present

## 2016-03-04 DIAGNOSIS — Z471 Aftercare following joint replacement surgery: Secondary | ICD-10-CM | POA: Diagnosis not present

## 2016-03-10 DIAGNOSIS — H5203 Hypermetropia, bilateral: Secondary | ICD-10-CM | POA: Diagnosis not present

## 2016-03-10 DIAGNOSIS — H2511 Age-related nuclear cataract, right eye: Secondary | ICD-10-CM | POA: Diagnosis not present

## 2016-03-10 DIAGNOSIS — H52223 Regular astigmatism, bilateral: Secondary | ICD-10-CM | POA: Diagnosis not present

## 2016-03-10 DIAGNOSIS — H524 Presbyopia: Secondary | ICD-10-CM | POA: Diagnosis not present

## 2016-03-12 DIAGNOSIS — M25512 Pain in left shoulder: Secondary | ICD-10-CM | POA: Diagnosis not present

## 2016-03-12 DIAGNOSIS — M19012 Primary osteoarthritis, left shoulder: Secondary | ICD-10-CM | POA: Diagnosis not present

## 2016-03-17 DIAGNOSIS — E039 Hypothyroidism, unspecified: Secondary | ICD-10-CM | POA: Diagnosis not present

## 2016-04-11 ENCOUNTER — Other Ambulatory Visit: Payer: Self-pay

## 2016-06-12 DIAGNOSIS — M19012 Primary osteoarthritis, left shoulder: Secondary | ICD-10-CM | POA: Diagnosis not present

## 2016-06-12 DIAGNOSIS — M25512 Pain in left shoulder: Secondary | ICD-10-CM | POA: Diagnosis not present

## 2016-06-25 DIAGNOSIS — Z23 Encounter for immunization: Secondary | ICD-10-CM | POA: Diagnosis not present

## 2016-07-23 DIAGNOSIS — Z0001 Encounter for general adult medical examination with abnormal findings: Secondary | ICD-10-CM | POA: Diagnosis not present

## 2016-07-23 DIAGNOSIS — Z1231 Encounter for screening mammogram for malignant neoplasm of breast: Secondary | ICD-10-CM | POA: Diagnosis not present

## 2016-07-23 DIAGNOSIS — R413 Other amnesia: Secondary | ICD-10-CM | POA: Diagnosis not present

## 2016-07-23 DIAGNOSIS — K219 Gastro-esophageal reflux disease without esophagitis: Secondary | ICD-10-CM | POA: Diagnosis not present

## 2016-07-23 DIAGNOSIS — Z1389 Encounter for screening for other disorder: Secondary | ICD-10-CM | POA: Diagnosis not present

## 2016-07-23 DIAGNOSIS — M542 Cervicalgia: Secondary | ICD-10-CM | POA: Diagnosis not present

## 2016-07-23 DIAGNOSIS — N3946 Mixed incontinence: Secondary | ICD-10-CM | POA: Diagnosis not present

## 2016-07-23 DIAGNOSIS — R5383 Other fatigue: Secondary | ICD-10-CM | POA: Diagnosis not present

## 2016-07-23 DIAGNOSIS — Z23 Encounter for immunization: Secondary | ICD-10-CM | POA: Diagnosis not present

## 2016-07-23 DIAGNOSIS — E039 Hypothyroidism, unspecified: Secondary | ICD-10-CM | POA: Diagnosis not present

## 2016-07-23 DIAGNOSIS — E785 Hyperlipidemia, unspecified: Secondary | ICD-10-CM | POA: Diagnosis not present

## 2018-03-10 ENCOUNTER — Ambulatory Visit (INDEPENDENT_AMBULATORY_CARE_PROVIDER_SITE_OTHER): Payer: Medicare Other

## 2018-03-10 DIAGNOSIS — R002 Palpitations: Secondary | ICD-10-CM | POA: Diagnosis not present

## 2018-03-16 ENCOUNTER — Telehealth: Payer: Self-pay | Admitting: *Deleted

## 2018-03-16 NOTE — Telephone Encounter (Signed)
Faxed Holter Results over to Haydee Salter at [facility], phone [phone] and fax [phone]

## 2019-03-17 NOTE — H&P (Signed)
Patient's anticipated LOS is less than 2 midnights, meeting these requirements: - Younger than 79 - Lives within 1 hour of care - Has a competent adult at home to recover with post-op recover - NO history of  - Chronic pain requiring opiods  - Diabetes  - Coronary Artery Disease  - Heart failure  - Heart attack  - Stroke  - DVT/VTE  - Cardiac arrhythmia  - Respiratory Failure/COPD  - Renal failure  - Anemia  - Advanced Liver disease       Robyn Lambert is an 78 y.o. female.    Chief Complaint: left shoulder pain  HPI: Pt is a 78 y.o. female complaining of left shoulder pain for multiple years. Pain had continually increased since the beginning. X-rays in the clinic show end-stage arthritic changes of the left shoulder. Pt has tried various conservative treatments which have failed to alleviate their symptoms, including injections and therapy. Various options are discussed with the patient. Risks, benefits and expectations were discussed with the patient. Patient understand the risks, benefits and expectations and wishes to proceed with surgery.   PCP:  Ernestene Kiel, MD  D/C Plans: Home  PMH: Past Medical History:  Diagnosis Date  . Anxiety   . Arthritis   . GERD (gastroesophageal reflux disease)   . Hyperlipidemia   . Joint pain    MULTIPLE ARTHRITIC JOINTS  . Rectocele   . Thyroid disease     PSH: Past Surgical History:  Procedure Laterality Date  . AORTA SURGERY  1971   TRAUMA  . CHOLECYSTECTOMY    . FINGER SURGERY     RT THUMB SURG  . HAMMER TOE SURGERY    . LIVER SURGERY  1971   TRAUMA  . MANDIBLE SURGERY  1971   TRAUMA  . TOTAL KNEE ARTHROPLASTY Left 02/28/2015   Procedure: LEFT TOTAL KNEE ARTHROPLASTY;  Surgeon: Gaynelle Arabian, MD;  Location: [facility];  Service: Orthopedics;  Laterality: Left;    Social History:  reports that she has never smoked. She does not have any smokeless tobacco history on file. She reports that she does not drink alcohol  or use drugs.  Allergies:  Allergies  Allergen Reactions  . Sulfa Antibiotics Hives    Medications: No current facility-administered medications for this encounter.    Current Outpatient Medications  Medication Sig Dispense Refill  . acetaminophen (TYLENOL) 500 MG tablet Take 500-1,000 mg by mouth every 6 (six) hours as needed for moderate pain or headache.    . bisacodyl (DULCOLAX) 10 MG suppository Place 1 suppository (10 mg total) rectally daily as needed for moderate constipation. 12 suppository 0  . diphenhydrAMINE (BENADRYL) 12.5 MG/5ML elixir Take 5-10 mLs (12.5-25 mg total) by mouth every 4 (four) hours as needed for itching. 120 mL 0  . docusate sodium (COLACE) 100 MG capsule Take 1 capsule (100 mg total) by mouth 2 (two) times daily. HOLD for loose stool or diarrhea. 10 capsule 0  . levothyroxine (SYNTHROID, LEVOTHROID) 50 MCG tablet Take 50 mcg by mouth daily before breakfast.    . MAGNESIUM GLUCONATE PO Take 1 tablet by mouth at bedtime.    . methocarbamol (ROBAXIN) 500 MG tablet Take 1 tablet (500 mg total) by mouth every 6 (six) hours as needed for muscle spasms. 80 tablet 0  . metoCLOPramide (REGLAN) 5 MG tablet Take 1 tablet (5 mg total) by mouth every 8 (eight) hours as needed for nausea (if ondansetron (ZOFRAN) ineffective.). 40 tablet 0  . ondansetron (ZOFRAN) 4  MG tablet Take 1 tablet (4 mg total) by mouth every 6 (six) hours as needed for nausea. 40 tablet 0  . oxyCODONE (OXY IR/ROXICODONE) 5 MG immediate release tablet Take 1-2 tablets (5-10 mg total) by mouth every 3 (three) hours as needed for moderate pain or severe pain. 80 tablet 0  . polyethylene glycol (MIRALAX / GLYCOLAX) packet Take 17 g by mouth at bedtime.    . rivaroxaban (XARELTO) 10 MG TABS tablet Take 1 tablet (10 mg total) by mouth daily with breakfast. Take Xarelto for two and a half more weeks, then discontinue Xarelto. Once the patient has completed the Xarelto, they may resume the 81 mg Aspirin. 19  tablet 0  . traMADol (ULTRAM) 50 MG tablet Take 1-2 tablets (50-100 mg total) by mouth every 6 (six) hours as needed (mild pain). 60 tablet 1    No results found for this or any previous visit (from the past 48 hour(s)). No results found.  ROS: Pain with rom of the left upper extremity  Physical Exam: Alert and oriented 78 y.o. female in no acute distress Cranial nerves 2-12 intact Cervical spine: full rom with no tenderness, nv intact distally Chest: active breath sounds bilaterally, no wheeze rhonchi or rales Heart: regular rate and rhythm, no murmur Abd: non tender non distended with active bowel sounds Hip is stable with rom  Left shoulder painful rom Weak with ER and IR of arm nv intact distally No rashes or edema   Assessment/Plan Assessment: left shoulder cuff arthropathy  Plan:  Patient will undergo a left reverse total shoulder by Dr. Veverly Fells at [facility]. Risks benefits and expectations were discussed with the patient. Patient understand risks, benefits and expectations and wishes to proceed. Preoperative templating of the joint replacement has been completed, documented, and submitted to the Operating Room personnel in order to optimize intra-operative equipment management.   Merla Riches PA-C, MPAS [facility] is now [employer] [address], [place], [place], [place] [address] Phone: [phone] [email or web address] Facebook  [employer]

## 2019-03-22 ENCOUNTER — Encounter (HOSPITAL_COMMUNITY)
Admission: RE | Admit: 2019-03-22 | Discharge: 2019-03-22 | Disposition: A | Discharge status: Home / Self Care | Payer: Medicare Other | Source: Ambulatory Visit | Attending: Orthopedic Surgery | Admitting: Orthopedic Surgery

## 2019-03-22 ENCOUNTER — Other Ambulatory Visit: Payer: Self-pay

## 2019-03-22 ENCOUNTER — Other Ambulatory Visit (HOSPITAL_COMMUNITY)
Admission: RE | Admit: 2019-03-22 | Discharge: 2019-03-22 | Disposition: A | Discharge status: Home / Self Care | Payer: Medicare Other | Source: Ambulatory Visit | Attending: Orthopedic Surgery | Admitting: Orthopedic Surgery

## 2019-03-22 ENCOUNTER — Encounter (HOSPITAL_COMMUNITY): Payer: Self-pay

## 2019-03-22 DIAGNOSIS — Z20828 Contact with and (suspected) exposure to other viral communicable diseases: Secondary | ICD-10-CM | POA: Diagnosis not present

## 2019-03-22 DIAGNOSIS — Z01812 Encounter for preprocedural laboratory examination: Secondary | ICD-10-CM | POA: Insufficient documentation

## 2019-03-22 HISTORY — DX: Palpitations: R00.2

## 2019-03-22 HISTORY — DX: Dyspnea, unspecified: R06.00

## 2019-03-22 HISTORY — DX: Cardiac arrhythmia, unspecified: I49.9

## 2019-03-22 LAB — CBC
HCT: 39.7 % (ref 36.0–46.0)
Hemoglobin: 12.3 g/dL (ref 12.0–15.0)
MCH: 29 pg (ref 26.0–34.0)
MCHC: 31 g/dL (ref 30.0–36.0)
MCV: 93.6 fL (ref 80.0–100.0)
Platelets: 258 10*3/uL (ref 150–400)
RBC: 4.24 MIL/uL (ref 3.87–5.11)
RDW: 13.5 % (ref 11.5–15.5)
WBC: 5.2 10*3/uL (ref 4.0–10.5)
nRBC: 0.4 % — ABNORMAL HIGH (ref 0.0–0.2)

## 2019-03-22 LAB — BASIC METABOLIC PANEL
Anion gap: 10 (ref 5–15)
BUN: 15 mg/dL (ref 8–23)
CO2: 29 mmol/L (ref 22–32)
Calcium: 9.2 mg/dL (ref 8.9–10.3)
Chloride: 104 mmol/L (ref 98–111)
Creatinine, Ser: 0.62 mg/dL (ref 0.44–1.00)
GFR calc Af Amer: >60 mL/min (ref >60)
GFR calc non Af Amer: >60 mL/min (ref >60)
Glucose, Bld: 105 mg/dL — ABNORMAL HIGH (ref 70–99)
Potassium: 3.9 mmol/L (ref 3.5–5.1)
Sodium: 143 mmol/L (ref 135–145)

## 2019-03-22 LAB — SURGICAL PCR SCREEN
MRSA, PCR: NEGATIVE
Staphylococcus aureus: NEGATIVE

## 2019-03-22 LAB — SARS CORONAVIRUS 2 (TAT 6-24 HRS): SARS Coronavirus 2: NEGATIVE

## 2019-03-22 NOTE — Patient Instructions (Addendum)
You have completed your covid-19 test , PLEASE BEGIN THE QUARANTINE INSTRUCTIONS AS OUTLINED IN YOUR HANDOUT.                Robyn Lambert    Your procedure is scheduled on: 03-25-2019   Report to [place]  Entrance    Report to admitting at 7:40AM   [address] [place].    Call this number if you have problems the morning of surgery [place] AND RINSE YOUR MOUTH OUT, NO CHEWING GUM CANDY OR MINTS.   Do not eat food After Midnight. YOU MAY HAVE CLEAR LIQUIDS FROM MIDNIGHT UNTIL 7:11AM. At 7:11AM Please finish the prescribed Pre-Surgery ENSURE drink. Nothing by mouth after you finish the ENSURE drink !   CLEAR LIQUID DIET   Foods Allowed                                                                     Foods Excluded  Coffee and tea, regular and decaf                             liquids that you cannot  Plain Jell-O any favor except red or purple                                           see through such as: Fruit ices (not with fruit pulp)                                     milk, soups, orange juice  Iced Popsicles                                    All solid food Carbonated beverages, regular and diet                                    Cranberry, grape and apple juices Sports drinks like Gatorade Lightly seasoned clear broth or consume(fat free) Sugar, honey syrup  Sample Menu Breakfast                                Lunch                                     Supper Cranberry juice                    Beef broth                            Chicken broth Jell-O  Grape juice                           Apple juice Coffee or tea                        Jell-O                                      Popsicle                                                Coffee or tea                        Coffee or  tea  _____________________________________________________________________    Take these medicines the morning of surgery with A SIP OF WATER: LEVOTHYROXINE, TYLENOL IF NEEDED                                 You may not have any metal on your body including hair pins and              piercings  Do not wear jewelry, make-up, lotions, powders or perfumes, deodorant             Do not wear nail polish.  Do not shave  48 hours prior to surgery.     Do not bring valuables to the hospital. Robyn Lambert.  Contacts, dentures or bridgework may not be worn into surgery.                Please read over the following fact sheets you were given: _____________________________________________________________________             [facility] - Preparing for Surgery Before surgery, you can play an important role.  Because skin is not sterile, your skin needs to be as free of germs as possible.  You can reduce the number of germs on your skin by washing with CHG (chlorahexidine gluconate) soap before surgery.  CHG is an antiseptic cleaner which kills germs and bonds with the skin to continue killing germs even after washing. Please DO NOT use if you have an allergy to CHG or antibacterial soaps.  If your skin becomes reddened/irritated stop using the CHG and inform your nurse when you arrive at Short Stay. Do not shave (including legs and underarms) for at least 48 hours prior to the first CHG shower.  You may shave your face/neck. Please follow these instructions carefully:  1.  Shower with CHG Soap the night before surgery and the  morning of Surgery.  2.  If you choose to wash your hair, wash your hair first as usual with your  normal  shampoo.  3.  After you shampoo, rinse your hair and body thoroughly to remove the  shampoo.                           4.  Use CHG as you would any other liquid soap.  You can apply chg directly  to the skin and wash                        Gently with a scrungie or clean washcloth.  5.  Apply the CHG Soap to your body ONLY FROM THE NECK DOWN.   Do not use on face/ open                           Wound or open sores. Avoid contact with eyes, ears mouth and genitals (private parts).                       Wash face,  Genitals (private parts) with your normal soap.             6.  Wash thoroughly, paying special attention to the area where your surgery  will be performed.  7.  Thoroughly rinse your body with warm water from the neck down.  8.  DO NOT shower/wash with your normal soap after using and rinsing off  the CHG Soap.                9.  Pat yourself dry with a clean towel.            10.  Wear clean pajamas.            11.  Place clean sheets on your bed the night of your first shower and do not  sleep with pets. Day of Surgery : Do not apply any lotions/deodorants the morning of surgery.  Please wear clean clothes to the hospital/surgery center.  FAILURE TO FOLLOW THESE INSTRUCTIONS MAY RESULT IN THE CANCELLATION OF YOUR SURGERY PATIENT SIGNATURE_________________________________  NURSE SIGNATURE__________________________________  ________________________________________________________________________   Robyn Lambert  An incentive spirometer is a tool that can help keep your lungs clear and active. This tool measures how well you are filling your lungs with each breath. Taking long deep breaths may help reverse or decrease the chance of developing breathing (pulmonary) problems (especially infection) following:  A long period of time when you are unable to move or be active. BEFORE THE PROCEDURE   If the spirometer includes an indicator to show your best effort, your nurse or respiratory therapist will set it to a desired goal.  If possible, sit up straight or lean slightly forward. Try not to slouch.  Hold the incentive spirometer in an upright position. INSTRUCTIONS FOR USE  1. Sit on the  edge of your bed if possible, or sit up as far as you can in bed or on a chair. 2. Hold the incentive spirometer in an upright position. 3. Breathe out normally. 4. Place the mouthpiece in your mouth and seal your lips tightly around it. 5. Breathe in slowly and as deeply as possible, raising the piston or the ball toward the top of the column. 6. Hold your breath for 3-5 seconds or for as long as possible. Allow the piston or ball to fall to the bottom of the column. 7. Remove the mouthpiece from your mouth and breathe out normally. 8. Rest for a few seconds and repeat Steps 1 through 7 at least 10 times every 1-2 hours when you are awake. Take your time and take a few normal breaths between deep breaths. 9. The spirometer may include an indicator to show your best effort. Use the indicator as a goal to work  toward during each repetition. 10. After each set of 10 deep breaths, practice coughing to be sure your lungs are clear. If you have an incision (the cut made at the time of surgery), support your incision when coughing by placing a pillow or rolled up towels firmly against it. Once you are able to get out of bed, walk around indoors and cough well. You may stop using the incentive spirometer when instructed by your caregiver.  RISKS AND COMPLICATIONS  Take your time so you do not get dizzy or light-headed.  If you are in pain, you may need to take or ask for pain medication before doing incentive spirometry. It is harder to take a deep breath if you are having pain. AFTER USE  Rest and breathe slowly and easily.  It can be helpful to keep track of a log of your progress. Your caregiver can provide you with a simple table to help with this. If you are using the spirometer at home, follow these instructions: [place] IF:   You are having difficultly using the spirometer.  You have trouble using the spirometer as often as instructed.  Your pain medication is not giving enough  relief while using the spirometer.  You develop fever of 100.5 F (38.1 C) or higher. SEEK IMMEDIATE MEDICAL CARE IF:   You cough up bloody sputum that had not been present before.  You develop fever of 102 F (38.9 C) or greater.  You develop worsening pain at or near the incision site. MAKE SURE YOU:   Understand these instructions.  Will watch your condition.  Will get help right away if you are not doing well or get worse. Document Released: 12/08/2006 Document Revised: 10/20/2011 Document Reviewed: 02/08/2007 [facility] Patient Information 2014 [place], [place].   ________________________________________________________________________

## 2019-03-22 NOTE — Progress Notes (Signed)
CONSENT UNCLEAR IF PHYSICIAN IS WANTING PATIENT TO SIGN INFORMED CONSENT FOR BLOOD TRANSFUSION. RN CALLED AND SPOKE WITH SURGERY SCHEDULER FOR DR.NORRIS, ASHLEY, AT [facility] TO MAKE AWARE . ASHLEY STATES OFFICE IS BUSY AND CANNOT GET IN TOUCH WITH PHYSICIAN IMMEDIATELY BUT WILL F/U TO CLARIFY WITH PHYSICIAN IF BLOOD CONSENT IS NEEDED AND WILL  PLACE NEW ORDER IN Epic FOR PATIENT TO SIGN DAY OF SURGERY IF WARRANTED. BLANK BLOOD CONSENT PLACED ON PATIENT CHART IN CASE.

## 2019-03-22 NOTE — Progress Notes (Signed)
EKG 02-15-2019 ON CHART FROM [facility] INTERNAL MEDICINE

## 2019-03-25 ENCOUNTER — Inpatient Hospital Stay (HOSPITAL_COMMUNITY): Payer: Medicare Other | Admitting: Anesthesiology

## 2019-03-25 ENCOUNTER — Encounter (HOSPITAL_COMMUNITY): Payer: Self-pay

## 2019-03-25 ENCOUNTER — Other Ambulatory Visit: Payer: Self-pay

## 2019-03-25 ENCOUNTER — Inpatient Hospital Stay (HOSPITAL_COMMUNITY): Payer: Medicare Other

## 2019-03-25 ENCOUNTER — Inpatient Hospital Stay (HOSPITAL_COMMUNITY): Payer: Medicare Other | Admitting: Physician Assistant

## 2019-03-25 ENCOUNTER — Inpatient Hospital Stay (HOSPITAL_COMMUNITY)
Admission: RE | Admit: 2019-03-25 | Discharge: 2019-03-26 | DRG: 483 | Disposition: A | Discharge status: Home / Self Care | Payer: Medicare Other | Attending: Orthopedic Surgery | Admitting: Orthopedic Surgery

## 2019-03-25 ENCOUNTER — Encounter (HOSPITAL_COMMUNITY)
Admission: RE | Disposition: A | Discharge status: Home / Self Care | Payer: Self-pay | Source: Home / Self Care | Attending: Orthopedic Surgery

## 2019-03-25 DIAGNOSIS — Z79890 Hormone replacement therapy (postmenopausal): Secondary | ICD-10-CM

## 2019-03-25 DIAGNOSIS — M19012 Primary osteoarthritis, left shoulder: Secondary | ICD-10-CM | POA: Diagnosis present

## 2019-03-25 DIAGNOSIS — E785 Hyperlipidemia, unspecified: Secondary | ICD-10-CM | POA: Diagnosis present

## 2019-03-25 DIAGNOSIS — M75102 Unspecified rotator cuff tear or rupture of left shoulder, not specified as traumatic: Secondary | ICD-10-CM | POA: Diagnosis present

## 2019-03-25 DIAGNOSIS — Z79899 Other long term (current) drug therapy: Secondary | ICD-10-CM

## 2019-03-25 DIAGNOSIS — K219 Gastro-esophageal reflux disease without esophagitis: Secondary | ICD-10-CM | POA: Diagnosis present

## 2019-03-25 DIAGNOSIS — E079 Disorder of thyroid, unspecified: Secondary | ICD-10-CM | POA: Diagnosis present

## 2019-03-25 DIAGNOSIS — Z96652 Presence of left artificial knee joint: Secondary | ICD-10-CM | POA: Diagnosis present

## 2019-03-25 DIAGNOSIS — Z7901 Long term (current) use of anticoagulants: Secondary | ICD-10-CM | POA: Diagnosis not present

## 2019-03-25 DIAGNOSIS — Z96612 Presence of left artificial shoulder joint: Secondary | ICD-10-CM

## 2019-03-25 DIAGNOSIS — F419 Anxiety disorder, unspecified: Secondary | ICD-10-CM | POA: Diagnosis present

## 2019-03-25 HISTORY — PX: REVERSE SHOULDER ARTHROPLASTY: SHX5054

## 2019-03-25 SURGERY — ARTHROPLASTY, SHOULDER, TOTAL, REVERSE
Anesthesia: General | Site: Shoulder | Laterality: Left

## 2019-03-25 MED ORDER — DOCUSATE SODIUM 100 MG PO CAPS: 100.0000 mg | ORAL_CAPSULE | Freq: Two times a day (BID) | ORAL | Status: DC

## 2019-03-25 MED ORDER — METHOCARBAMOL 500 MG IVPB - SIMPLE MED
500.0000 mg | Freq: Four times a day (QID) | INTRAVENOUS | Status: DC | PRN
  Administered 2019-03-25: 500 mg via INTRAVENOUS
  Filled 2019-03-25: qty 50

## 2019-03-25 MED ORDER — PROPOFOL 10 MG/ML IV BOLUS
INTRAVENOUS | Status: DC | PRN
  Administered 2019-03-25: 100 mg via INTRAVENOUS

## 2019-03-25 MED ORDER — ACETAMINOPHEN 325 MG PO TABS
325.0000 mg | ORAL_TABLET | Freq: Four times a day (QID) | ORAL | Status: DC | PRN
  Filled 2019-03-25: qty 2

## 2019-03-25 MED ORDER — BUPIVACAINE LIPOSOME 1.3 % IJ SUSP
INTRAMUSCULAR | Status: DC | PRN
  Administered 2019-03-25: 10 mL via PERINEURAL

## 2019-03-25 MED ORDER — DOCUSATE SODIUM 100 MG PO CAPS
100.0000 mg | ORAL_CAPSULE | Freq: Two times a day (BID) | ORAL | Status: DC
  Administered 2019-03-25 – 2019-03-26 (×2): 100 mg via ORAL
  Filled 2019-03-25 (×2): qty 1

## 2019-03-25 MED ORDER — FENTANYL CITRATE (PF) 100 MCG/2ML IJ SOLN
50.0000 ug | INTRAMUSCULAR | Status: DC
  Administered 2019-03-25: 50 ug via INTRAVENOUS
  Filled 2019-03-25: qty 2

## 2019-03-25 MED ORDER — PHENYLEPHRINE 40 MCG/ML (10ML) SYRINGE FOR IV PUSH (FOR BLOOD PRESSURE SUPPORT)
PREFILLED_SYRINGE | INTRAVENOUS | Status: AC
  Filled 2019-03-25: qty 10

## 2019-03-25 MED ORDER — ACETAMINOPHEN 500 MG PO TABS
500.0000 mg | ORAL_TABLET | Freq: Four times a day (QID) | ORAL | Status: DC | PRN
  Administered 2019-03-26: 500 mg via ORAL
  Filled 2019-03-25: qty 1

## 2019-03-25 MED ORDER — TRAMADOL HCL 50 MG PO TABS
50.0000 mg | ORAL_TABLET | Freq: Four times a day (QID) | ORAL | Status: DC | PRN
  Administered 2019-03-25 – 2019-03-26 (×2): 100 mg via ORAL
  Filled 2019-03-25 (×2): qty 2

## 2019-03-25 MED ORDER — BUPIVACAINE-EPINEPHRINE (PF) 0.25% -1:200000 IJ SOLN
INTRAMUSCULAR | Status: AC
  Filled 2019-03-25: qty 30

## 2019-03-25 MED ORDER — FENTANYL CITRATE (PF) 250 MCG/5ML IJ SOLN
INTRAMUSCULAR | Status: DC | PRN
  Administered 2019-03-25: 50 ug via INTRAVENOUS

## 2019-03-25 MED ORDER — PHENYLEPHRINE HCL (PRESSORS) 10 MG/ML IV SOLN
INTRAVENOUS | Status: AC
  Filled 2019-03-25: qty 1

## 2019-03-25 MED ORDER — SUGAMMADEX SODIUM 200 MG/2ML IV SOLN
INTRAVENOUS | Status: DC | PRN
  Administered 2019-03-25: 200 mg via INTRAVENOUS

## 2019-03-25 MED ORDER — METHOCARBAMOL 500 MG PO TABS: 500.0000 mg | ORAL_TABLET | Freq: Four times a day (QID) | ORAL | Status: DC | PRN

## 2019-03-25 MED ORDER — MORPHINE SULFATE (PF) 2 MG/ML IV SOLN: 0.5000 mg | INTRAVENOUS | Status: DC | PRN

## 2019-03-25 MED ORDER — SODIUM CHLORIDE 0.9 % IR SOLN
Status: DC | PRN
  Administered 2019-03-25: 1000 mL

## 2019-03-25 MED ORDER — PHENOL 1.4 % MT LIQD: 1.0000 | OROMUCOSAL | Status: DC | PRN

## 2019-03-25 MED ORDER — LIDOCAINE 2% (20 MG/ML) 5 ML SYRINGE
INTRAMUSCULAR | Status: DC | PRN
  Administered 2019-03-25: 60 mg via INTRAVENOUS

## 2019-03-25 MED ORDER — METHOCARBAMOL 500 MG IVPB - SIMPLE MED
INTRAVENOUS | Status: AC
  Filled 2019-03-25: qty 50

## 2019-03-25 MED ORDER — CEFAZOLIN SODIUM-DEXTROSE 2-4 GM/100ML-% IV SOLN
2.0000 g | Freq: Four times a day (QID) | INTRAVENOUS | Status: AC
  Administered 2019-03-25 – 2019-03-26 (×3): 2 g via INTRAVENOUS
  Filled 2019-03-25 (×3): qty 100

## 2019-03-25 MED ORDER — LIDOCAINE 2% (20 MG/ML) 5 ML SYRINGE
INTRAMUSCULAR | Status: AC
  Filled 2019-03-25: qty 5

## 2019-03-25 MED ORDER — STERILE WATER FOR IRRIGATION IR SOLN
Status: DC | PRN
  Administered 2019-03-25 (×2): 1000 mL

## 2019-03-25 MED ORDER — SUCCINYLCHOLINE CHLORIDE 200 MG/10ML IV SOSY
PREFILLED_SYRINGE | INTRAVENOUS | Status: AC
  Filled 2019-03-25: qty 10

## 2019-03-25 MED ORDER — CEFAZOLIN SODIUM-DEXTROSE 2-4 GM/100ML-% IV SOLN
2.0000 g | INTRAVENOUS | Status: AC
  Administered 2019-03-25: 2 g via INTRAVENOUS
  Filled 2019-03-25: qty 100

## 2019-03-25 MED ORDER — ADULT MULTIVITAMIN W/MINERALS CH
1.0000 | ORAL_TABLET | Freq: Every day | ORAL | Status: DC
  Administered 2019-03-25 – 2019-03-26 (×2): 1 via ORAL
  Filled 2019-03-25 (×2): qty 1

## 2019-03-25 MED ORDER — FENTANYL CITRATE (PF) 100 MCG/2ML IJ SOLN
INTRAMUSCULAR | Status: AC
  Filled 2019-03-25: qty 2

## 2019-03-25 MED ORDER — MELATONIN 5 MG PO TABS
10.0000 mg | ORAL_TABLET | Freq: Every day | ORAL | Status: DC
  Administered 2019-03-25: 10 mg via ORAL
  Filled 2019-03-25 (×2): qty 2

## 2019-03-25 MED ORDER — SODIUM CHLORIDE 0.9 % IV SOLN
INTRAVENOUS | Status: DC
  Administered 2019-03-25 – 2019-03-26 (×2): via INTRAVENOUS

## 2019-03-25 MED ORDER — OXYCODONE HCL 5 MG/5ML PO SOLN: 5.0000 mg | Freq: Once | ORAL | Status: DC | PRN

## 2019-03-25 MED ORDER — EPHEDRINE 5 MG/ML INJ
INTRAVENOUS | Status: AC
  Filled 2019-03-25: qty 10

## 2019-03-25 MED ORDER — ONDANSETRON HCL 4 MG/2ML IJ SOLN: 4.0000 mg | Freq: Four times a day (QID) | INTRAMUSCULAR | Status: DC | PRN

## 2019-03-25 MED ORDER — ONDANSETRON HCL 4 MG/2ML IJ SOLN
INTRAMUSCULAR | Status: DC | PRN
  Administered 2019-03-25: 4 mg via INTRAVENOUS

## 2019-03-25 MED ORDER — LEVOTHYROXINE SODIUM 75 MCG PO TABS
75.0000 ug | ORAL_TABLET | Freq: Every day | ORAL | Status: DC
  Administered 2019-03-26: 75 ug via ORAL
  Filled 2019-03-25: qty 1

## 2019-03-25 MED ORDER — ROCURONIUM BROMIDE 10 MG/ML (PF) SYRINGE
PREFILLED_SYRINGE | INTRAVENOUS | Status: AC
  Filled 2019-03-25: qty 10

## 2019-03-25 MED ORDER — MENTHOL 3 MG MT LOZG: 1.0000 | LOZENGE | OROMUCOSAL | Status: DC | PRN

## 2019-03-25 MED ORDER — HYDROCODONE-ACETAMINOPHEN 5-325 MG PO TABS: 1.0000 | ORAL_TABLET | ORAL | Status: DC | PRN

## 2019-03-25 MED ORDER — B COMPLEX-C PO TABS
1.0000 | ORAL_TABLET | Freq: Every day | ORAL | Status: DC
  Administered 2019-03-26: 1 via ORAL
  Filled 2019-03-25 (×2): qty 1

## 2019-03-25 MED ORDER — OXYCODONE HCL 5 MG PO TABS: 5.0000 mg | ORAL_TABLET | Freq: Once | ORAL | Status: DC | PRN

## 2019-03-25 MED ORDER — DEXAMETHASONE SODIUM PHOSPHATE 10 MG/ML IJ SOLN
INTRAMUSCULAR | Status: DC | PRN
  Administered 2019-03-25: 10 mg via INTRAVENOUS

## 2019-03-25 MED ORDER — TRAMADOL HCL 50 MG PO TABS: 50.0000 mg | ORAL_TABLET | Freq: Four times a day (QID) | ORAL | 0 refills | Status: AC | PRN

## 2019-03-25 MED ORDER — POLYETHYLENE GLYCOL 3350 17 G PO PACK: 17.0000 g | PACK | Freq: Every day | ORAL | Status: DC | PRN

## 2019-03-25 MED ORDER — LACTATED RINGERS IV SOLN
INTRAVENOUS | Status: DC
  Administered 2019-03-25: 08:00:00 via INTRAVENOUS

## 2019-03-25 MED ORDER — METOCLOPRAMIDE HCL 5 MG/ML IJ SOLN: 5.0000 mg | Freq: Three times a day (TID) | INTRAMUSCULAR | Status: DC | PRN

## 2019-03-25 MED ORDER — ROCURONIUM BROMIDE 10 MG/ML (PF) SYRINGE
PREFILLED_SYRINGE | INTRAVENOUS | Status: DC | PRN
  Administered 2019-03-25: 40 mg via INTRAVENOUS

## 2019-03-25 MED ORDER — EPHEDRINE SULFATE-NACL 50-0.9 MG/10ML-% IV SOSY
PREFILLED_SYRINGE | INTRAVENOUS | Status: DC | PRN
  Administered 2019-03-25: 10 mg via INTRAVENOUS
  Administered 2019-03-25: 20 mg via INTRAVENOUS

## 2019-03-25 MED ORDER — SODIUM CHLORIDE 0.9 % IV SOLN
INTRAVENOUS | Status: DC | PRN
  Administered 2019-03-25: 11:00:00 40 ug/min via INTRAVENOUS

## 2019-03-25 MED ORDER — ASPIRIN 81 MG PO CHEW
81.0000 mg | CHEWABLE_TABLET | Freq: Two times a day (BID) | ORAL | Status: DC
  Administered 2019-03-25 – 2019-03-26 (×2): 81 mg via ORAL
  Filled 2019-03-25 (×2): qty 1

## 2019-03-25 MED ORDER — FENTANYL CITRATE (PF) 100 MCG/2ML IJ SOLN
25.0000 ug | INTRAMUSCULAR | Status: DC | PRN
  Administered 2019-03-25: 25 ug via INTRAVENOUS

## 2019-03-25 MED ORDER — ONDANSETRON HCL 4 MG/2ML IJ SOLN
INTRAMUSCULAR | Status: AC
  Filled 2019-03-25: qty 2

## 2019-03-25 MED ORDER — DEXAMETHASONE SODIUM PHOSPHATE 10 MG/ML IJ SOLN
INTRAMUSCULAR | Status: AC
  Filled 2019-03-25: qty 1

## 2019-03-25 MED ORDER — BUPIVACAINE-EPINEPHRINE (PF) 0.25% -1:200000 IJ SOLN
INTRAMUSCULAR | Status: DC | PRN
  Administered 2019-03-25: 15 mL

## 2019-03-25 MED ORDER — BUPIVACAINE HCL (PF) 0.5 % IJ SOLN
INTRAMUSCULAR | Status: DC | PRN
  Administered 2019-03-25: 10 mL via PERINEURAL

## 2019-03-25 MED ORDER — ONDANSETRON HCL 4 MG PO TABS: 4.0000 mg | ORAL_TABLET | Freq: Four times a day (QID) | ORAL | Status: DC | PRN

## 2019-03-25 MED ORDER — B COMPLEX PO TABS
1.0000 | ORAL_TABLET | Freq: Every day | ORAL | Status: DC
  Filled 2019-03-25: qty 1

## 2019-03-25 MED ORDER — PHENYLEPHRINE 40 MCG/ML (10ML) SYRINGE FOR IV PUSH (FOR BLOOD PRESSURE SUPPORT)
PREFILLED_SYRINGE | INTRAVENOUS | Status: DC | PRN
  Administered 2019-03-25 (×2): 80 ug via INTRAVENOUS

## 2019-03-25 MED ORDER — RED YEAST RICE 600 MG PO TABS: 600.0000 mg | ORAL_TABLET | Freq: Every day | ORAL | Status: DC

## 2019-03-25 MED ORDER — CHLORHEXIDINE GLUCONATE 4 % EX LIQD: 60.0000 mL | Freq: Once | CUTANEOUS | Status: DC

## 2019-03-25 MED ORDER — METOCLOPRAMIDE HCL 5 MG PO TABS: 5.0000 mg | ORAL_TABLET | Freq: Three times a day (TID) | ORAL | Status: DC | PRN

## 2019-03-25 MED ORDER — DOCUSATE SODIUM 100 MG PO CAPS: 100.0000 mg | ORAL_CAPSULE | Freq: Two times a day (BID) | ORAL | 0 refills | Status: DC

## 2019-03-25 SURGICAL SUPPLY — 75 items
AID PSTN UNV HD RSTRNT DISP (MISCELLANEOUS) ×1
BAG SPEC THK2 15X12 ZIP CLS (MISCELLANEOUS) ×1
BAG ZIPLOCK 12X15 (MISCELLANEOUS) ×3 IMPLANT
BIT DRILL 1.6MX128 (BIT) ×1 IMPLANT
BIT DRILL 1.6MX128MM (BIT) ×1
BIT DRILL 170X2.5X (BIT) IMPLANT
BIT DRL 170X2.5X (BIT) ×1
BLADE SAG 18X100X1.27 (BLADE) ×3 IMPLANT
CLOSURE STERI-STRIP 1/2X4 (GAUZE/BANDAGES/DRESSINGS) ×1
CLOSURE WOUND 1/2 X4 (GAUZE/BANDAGES/DRESSINGS) ×1
CLSR STERI-STRIP ANTIMIC 1/2X4 (GAUZE/BANDAGES/DRESSINGS) ×1 IMPLANT
COVER BACK TABLE 60X90IN (DRAPES) ×3 IMPLANT
COVER SURGICAL LIGHT HANDLE (MISCELLANEOUS) ×3 IMPLANT
COVER WAND RF STERILE (DRAPES) IMPLANT
DECANTER SPIKE VIAL GLASS SM (MISCELLANEOUS) ×3 IMPLANT
DRAPE INCISE IOBAN 66X45 STRL (DRAPES) ×3 IMPLANT
DRAPE ORTHO SPLIT 77X108 STRL (DRAPES) ×6
DRAPE SHEET LG 3/4 BI-LAMINATE (DRAPES) ×3 IMPLANT
DRAPE SURG ORHT 6 SPLT 77X108 (DRAPES) ×2 IMPLANT
DRAPE U-SHAPE 47X51 STRL (DRAPES) ×3 IMPLANT
DRILL 2.5 (BIT) ×3
DRSG ADAPTIC 3X8 NADH LF (GAUZE/BANDAGES/DRESSINGS) ×3 IMPLANT
DRSG PAD ABDOMINAL 8X10 ST (GAUZE/BANDAGES/DRESSINGS) ×3 IMPLANT
DURAPREP 26ML APPLICATOR (WOUND CARE) ×3 IMPLANT
ELECT BLADE TIP CTD 4 INCH (ELECTRODE) ×3 IMPLANT
ELECT NDL TIP 2.8 STRL (NEEDLE) ×1 IMPLANT
ELECT NEEDLE TIP 2.8 STRL (NEEDLE) ×3 IMPLANT
ELECT REM PT RETURN 15FT ADLT (MISCELLANEOUS) ×3 IMPLANT
EPI LT SZ 1 (Orthopedic Implant) ×3 IMPLANT
EPIPHYSIS LT SZ 1 (Orthopedic Implant) IMPLANT
GAUZE SPONGE 4X4 12PLY STRL (GAUZE/BANDAGES/DRESSINGS) ×3 IMPLANT
GLENOSPHERE XTEND RSA 38 SD +4 (Joint) ×2 IMPLANT
GLOVE BIOGEL PI ORTHO PRO 7.5 (GLOVE) ×2
GLOVE BIOGEL PI ORTHO PRO SZ8 (GLOVE) ×2
GLOVE ORTHO TXT STRL SZ7.5 (GLOVE) ×3 IMPLANT
GLOVE PI ORTHO PRO STRL 7.5 (GLOVE) ×1 IMPLANT
GLOVE PI ORTHO PRO STRL SZ8 (GLOVE) ×1 IMPLANT
GLOVE SURG ORTHO 8.5 STRL (GLOVE) ×3 IMPLANT
GOWN STRL REUS W/TWL XL LVL3 (GOWN DISPOSABLE) ×6 IMPLANT
KIT BASIN OR (CUSTOM PROCEDURE TRAY) ×3 IMPLANT
KIT TURNOVER KIT A (KITS) IMPLANT
MANIFOLD NEPTUNE II (INSTRUMENTS) ×3 IMPLANT
METAGLENE DELTA EXTEND (Trauma) IMPLANT
METAGLENE DXTEND (Trauma) ×3 IMPLANT
NDL MAYO CATGUT SZ4 TPR NDL (NEEDLE) IMPLANT
NEEDLE MAYO CATGUT SZ4 (NEEDLE) IMPLANT
NS IRRIG 1000ML POUR BTL (IV SOLUTION) ×3 IMPLANT
PACK SHOULDER (CUSTOM PROCEDURE TRAY) ×3 IMPLANT
PIN GUIDE 1.2 (PIN) ×2 IMPLANT
PIN GUIDE GLENOPHERE 1.5MX300M (PIN) ×2 IMPLANT
PIN METAGLENE 2.5 (PIN) ×2 IMPLANT
PROTECTOR NERVE ULNAR (MISCELLANEOUS) ×3 IMPLANT
RESTRAINT HEAD UNIVERSAL NS (MISCELLANEOUS) ×3 IMPLANT
SCREW 4.5X18MM (Screw) ×6 IMPLANT
SCREW 48L (Screw) ×2 IMPLANT
SCREW BN 18X4.5XSTRL SHLDR (Screw) IMPLANT
SCREW LOCK DELTA XTEND 4.5X30 (Screw) ×2 IMPLANT
SLING ARM FOAM STRAP LRG (SOFTGOODS) ×2 IMPLANT
SMARTMIX MINI TOWER (MISCELLANEOUS)
SPACER 38 PLUS 3 (Spacer) ×2 IMPLANT
SPONGE LAP 4X18 RFD (DISPOSABLE) IMPLANT
STEM 12 HA (Stem) ×2 IMPLANT
STRIP CLOSURE SKIN 1/2X4 (GAUZE/BANDAGES/DRESSINGS) ×2 IMPLANT
SUCTION FRAZIER HANDLE 10FR (MISCELLANEOUS) ×2
SUCTION TUBE FRAZIER 10FR DISP (MISCELLANEOUS) ×1 IMPLANT
SUT FIBERWIRE #2 38 T-5 BLUE (SUTURE) ×6
SUT MNCRL AB 4-0 PS2 18 (SUTURE) ×3 IMPLANT
SUT VIC AB 0 CT1 36 (SUTURE) ×6 IMPLANT
SUT VIC AB 0 CT2 27 (SUTURE) ×3 IMPLANT
SUT VIC AB 2-0 CT1 27 (SUTURE) ×3
SUT VIC AB 2-0 CT1 TAPERPNT 27 (SUTURE) ×1 IMPLANT
SUTURE FIBERWR #2 38 T-5 BLUE (SUTURE) ×1 IMPLANT
TOWEL OR 17X26 10 PK STRL BLUE (TOWEL DISPOSABLE) ×3 IMPLANT
TOWER SMARTMIX MINI (MISCELLANEOUS) IMPLANT
YANKAUER SUCT BULB TIP 10FT TU (MISCELLANEOUS) ×3 IMPLANT

## 2019-03-25 NOTE — Anesthesia Postprocedure Evaluation (Signed)
Anesthesia Post Note  Patient: Eliseo Squires  Procedure(s) Performed: REVERSE SHOULDER ARTHROPLASTY (Left Shoulder)     Patient location during evaluation: PACU Anesthesia Type: General and Regional Level of consciousness: awake and alert Pain management: pain level controlled Vital Signs Assessment: post-procedure vital signs reviewed and stable Respiratory status: spontaneous breathing, nonlabored ventilation, respiratory function stable and patient connected to nasal cannula oxygen Cardiovascular status: blood pressure returned to baseline and stable Postop Assessment: no apparent nausea or vomiting Anesthetic complications: no    Last Vitals:  Vitals:   03/25/19 1245 03/25/19 1300  BP: 126/61 126/62  Pulse: 80 79  Resp: 11 12  Temp:  (!) 36.3 C  SpO2: 100% 100%    Last Pain:  Vitals:   03/25/19 1330  TempSrc:   PainSc: 0-No pain                 Menelik Mcfarren S

## 2019-03-25 NOTE — Discharge Instructions (Signed)
Ice to the shoulder constantly.  Keep the incision covered and clean and dry for one week, then ok to get it wet in the shower. ° °Do exercise as instructed several times per day. ° °DO NOT reach behind your back or push up out of a chair with the operative arm. ° °Use a sling while you are up and around for comfort, may remove while seated.  Keep pillow propped behind the operative elbow. ° °Follow up with Dr Banessa Mao in two weeks in the office, call [phone] for appt °

## 2019-03-25 NOTE — Anesthesia Preprocedure Evaluation (Signed)
Anesthesia Evaluation  Patient identified by MRN, date of birth, ID band Patient awake    Reviewed: Allergy & Precautions, H&P , NPO status , Patient's Chart, lab work & pertinent test results  Airway Mallampati: II   Neck ROM: full    Dental   Pulmonary shortness of breath,    breath sounds clear to auscultation       Cardiovascular negative cardio ROS   Rhythm:regular Rate:Normal     Neuro/Psych PSYCHIATRIC DISORDERS Anxiety    GI/Hepatic GERD  ,  Endo/Other    Renal/GU      Musculoskeletal  (+) Arthritis ,   Abdominal   Peds  Hematology   Anesthesia Other Findings   Reproductive/Obstetrics                             Anesthesia Physical Anesthesia Plan  ASA: II  Anesthesia Plan: General   Post-op Pain Management:  Regional for Post-op pain   Induction: Intravenous  PONV Risk Score and Plan: 3 and Ondansetron, Dexamethasone, Midazolam and Treatment may vary due to age or medical condition  Airway Management Planned: Oral ETT  Additional Equipment:   Intra-op Plan:   Post-operative Plan: Extubation in OR  Informed Consent: I have reviewed the patients History and Physical, chart, labs and discussed the procedure including the risks, benefits and alternatives for the proposed anesthesia with the patient or authorized representative who has indicated his/her understanding and acceptance.       Plan Discussed with: CRNA, Anesthesiologist and Surgeon  Anesthesia Plan Comments:         Anesthesia Quick Evaluation

## 2019-03-25 NOTE — Anesthesia Procedure Notes (Signed)
Anesthesia Regional Block: Interscalene brachial plexus block   Pre-Anesthetic Checklist: ,, timeout performed, Correct Patient, Correct Site, Correct Laterality, Correct Procedure, Correct Position, site marked, Risks and benefits discussed,  Surgical consent,  Pre-op evaluation,  At surgeon's request and post-op pain management  Laterality: Left  Prep: chloraprep       Needles:  Injection technique: Single-shot  Needle Type: Echogenic Stimulator Needle     Needle Length: 5cm  Needle Gauge: 22     Additional Needles:   Procedures:, nerve stimulator,,,,,,,   Nerve Stimulator or Paresthesia:  Response: biceps flexion, 0.45 mA,   Additional Responses:   Narrative:  Start time: 03/25/2019 9:10 AM End time: 03/25/2019 9:16 AM Injection made incrementally with aspirations every 5 mL.  Performed by: Personally  Anesthesiologist: Albertha Ghee, MD  Additional Notes: Functioning IV was confirmed and monitors were applied.  A 26mm 22ga Arrow echogenic stimulator needle was used. Sterile prep and drape,hand hygiene and sterile gloves were used.  Negative aspiration and negative test dose prior to incremental administration of local anesthetic. The patient tolerated the procedure well.  Ultrasound guidance: relevent anatomy identified, needle position confirmed, local anesthetic spread visualized around nerve(s), vascular puncture avoided.  Image printed for medical record.

## 2019-03-25 NOTE — Brief Op Note (Signed)
03/25/2019  11:51 AM  PATIENT:  Robyn Lambert  78 y.o. female  PRE-OPERATIVE DIAGNOSIS:  Left shoulder cuff arthropathy  POST-OPERATIVE DIAGNOSIS:  Left shoulder cuff arthropathy  PROCEDURE:  Procedure(s) with comments: REVERSE SHOULDER ARTHROPLASTY (Left) - with interscalene block DePuy Delta Xtend  SURGEON:  Surgeon(s) and Role:    Netta Cedars, MD - Primary  PHYSICIAN ASSISTANT:   ASSISTANTS: Ventura Bruns, PA-C   ANESTHESIA:   regional and general  EBL:  100 mL   BLOOD ADMINISTERED:none  DRAINS: none   LOCAL MEDICATIONS USED:  MARCAINE     SPECIMEN:  No Specimen  DISPOSITION OF SPECIMEN:  N/A  COUNTS:  YES  TOURNIQUET:  * No tourniquets in log *  DICTATION: .Other Dictation: Dictation Number [id number]  PLAN OF CARE: Admit to inpatient   PATIENT DISPOSITION:  PACU - hemodynamically stable.   Delay start of Pharmacological VTE agent (>24hrs) due to surgical blood loss or risk of bleeding: not applicable

## 2019-03-25 NOTE — Anesthesia Procedure Notes (Signed)
Procedure Name: Intubation Date/Time: 03/25/2019 10:16 AM Performed by: Talbot Grumbling, CRNA Pre-anesthesia Checklist: Patient identified, Emergency Drugs available, Patient being monitored and Suction available Patient Re-evaluated:Patient Re-evaluated prior to induction Oxygen Delivery Method: Circle system utilized Preoxygenation: Pre-oxygenation with 100% oxygen Induction Type: IV induction Ventilation: Mask ventilation without difficulty Laryngoscope Size: Mac and 3 Grade View: Grade I Tube type: Oral Tube size: 7.0 mm Number of attempts: 1 Airway Equipment and Method: Stylet Placement Confirmation: ETT inserted through vocal cords under direct vision,  positive ETCO2 and breath sounds checked- equal and bilateral Secured at: 21 cm Tube secured with: Tape Dental Injury: Teeth and Oropharynx as per pre-operative assessment

## 2019-03-25 NOTE — Progress Notes (Signed)
Assisted Dr. Hodierne with left, ultrasound guided, interscalene  block. Side rails up, monitors on throughout procedure. See vital signs in flow sheet. Tolerated Procedure well. 

## 2019-03-25 NOTE — Transfer of Care (Signed)
Immediate Anesthesia Transfer of Care Note  Patient: Robyn Lambert  Procedure(s) Performed: REVERSE SHOULDER ARTHROPLASTY (Left Shoulder)  Patient Location: PACU  Anesthesia Type:General and Regional  Level of Consciousness: sedated  Airway & Oxygen Therapy: Patient Spontanous Breathing and Patient connected to face mask oxygen  Post-op Assessment: Report given to RN and Post -op Vital signs reviewed and stable  Post vital signs: Reviewed and stable  Last Vitals:  Vitals Value Taken Time  BP    Temp    Pulse 84 03/25/19 1202  Resp    SpO2 100 % 03/25/19 1202  Vitals shown include unvalidated device data.  Last Pain:  Vitals:   03/25/19 0943  TempSrc:   PainSc: 0-No pain      Patients Stated Pain Goal: 3 (01/58/68 2574)  Complications: No apparent anesthesia complications

## 2019-03-25 NOTE — Op Note (Signed)
Robyn Lambert, [name] MEDICAL RECORD [id number] ACCOUNT [id number] DATE OF BIRTH:1940/09/11 FACILITY: [facility] LOCATION: [facility] PHYSICIAN:STEVEN R. 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  PROCEDURE PERFORMED:  Left reverse total shoulder arthroplasty utilizing DePuy Delta Xtend prosthesis.  ATTENDING SURGEON:  Esmond Plants, MD  ASSISTANT:  Darol Destine, [place], who was scrubbed during the entire procedure and necessary for satisfactory completion of surgery.  ANESTHESIA:  General anesthesia was used plus interscalene block.  ESTIMATED BLOOD LOSS:  Less than 100 mL.  FLUID REPLACEMENT:  1000 mL crystalloid.  INSTRUMENT COUNTS:  Correct.  COMPLICATIONS:  No complications.  ANTIBIOTICS:  Perioperative antibiotics were given.  INDICATIONS:  The patient is a 78 year old female who presents with worsening left shoulder pain and dysfunction secondary to rotator cuff tear arthropathy.  The patient has bone-on-bone, has severe swelling and pain in the shoulder.  Having failed an  extended period of conservative management, the patient desires operative treatment to restore function and eliminate pain.  Informed consent obtained.  DESCRIPTION OF PROCEDURE:  After an adequate level of anesthesia was achieved, the patient was positioned in the modified beach chair position.  Left shoulder correctly identified and sterilely prepped and draped in the usual manner.  Time-out called,  verifying correct patient, correct site.  We entered the patient's shoulder using standard deltopectoral approach starting at the coracoid process extending down to the anterior humerus.  Dissection down through subcutaneous tissues using Bovie.  We  identified the cephalic  vein, took it laterally with the deltoid pectoralis taken medially.  Conjoined tendon identified and retracted medially.  We tenodesed the biceps in situ with 0 Vicryl figure-of-eight suture with the soft tissue tenodesis  incorporating the pectoralis tendon.  We released the subscapularis off the lesser tuberosity and tagged for potential repair at the end.  We retracted that.  We also released the inferior capsule progressively externally rotating the humerus, released  the remaining rotator cuff and debrided that.  We extended the shoulder delivering the humeral head out the wound.  End-stage arthritis was noted with collapse of the humeral head.  We entered the proximal humerus with a 6 mm reamer, reamed up to a size  12.  We then went ahead and used the 12 mm intramedullary resection guide to resect the head at 10 degrees of retroversion with an oscillating saw.  Once we had removed the head.  We kept that for bone graft at the end.  We removed excess osteophytes  with a rongeur.  Next, we subluxed the humerus posteriorly.  We gained 360 degree exposure of the glenoid face.  We removed the peripheral osteophytes and the glenoid labrum remnant and the capsule.  With good exposure, we placed our central guide pin,  reamed for the metaglene baseplate and then also drilled out the central peg hole.  We impacted the metaglene into position and had good support and then we placed a 48 screw inferiorly, a 30 at the base of the coracoid and an 18 nonlocked anterior and  posterior.  Superior and inferior screws were locked.  We had good baseplate security and orientation.  We placed a 38+4 glenosphere onto the baseplate and then secured that.  Once that was verified, I did a finger sweep to make sure that there was no  soft tissue caught in  that bearing.  We then went back to the humeral side and reamed for the size 1 left metaphysis.  This was a 12 stem 1 left set on the 0 setting and placed in 10 degrees of  retroversion.  A trial 38+3 trial poly and that reduced  nicely.  It was quite stable and no gap with inferior pole or with external rotation and no impingement.  We irrigated thoroughly, removed all trial components, placed sutures for repair of the subscap, but ultimately we were not able to repair the subscap, it was too contracted medially and would have restricted motion.  After thorough irrigation used  available bone graft from the head and a press-fit type impaction grafting technique and impacted the humeral stem into place, HA coated.  With adding good position with 10 degrees of retroversion we selected a 38+3 real poly and impacted that in place  in the humeral side and reduced the shoulder.  We had nice little pop as it reduced.  Conjoined tendon appropriately tensioned and everything nice and stable.  We irrigated thoroughly, resected the remaining subscap and then repaired the deltopectoral  interval with 0 Vicryl suture followed by 2-0 Vicryl for subcutaneous closure and 4-0 Monocryl for skin.  Steri-Strips applied followed by sterile dressing.  The patient tolerated surgery well.  TN/NUANCE  D:03/25/2019 T:03/25/2019 JOB:[id number]

## 2019-03-25 NOTE — Progress Notes (Signed)
PHARMACIST - PHYSICIAN ORDER COMMUNICATION  CONCERNING: P&T Medication Policy on Herbal Medications  DESCRIPTION:  This patient's order for:  Red yeast rice  has been noted.  This product(s) is classified as an "herbal" or natural product. Due to a lack of definitive safety studies or FDA approval, nonstandard manufacturing practices, plus the potential risk of unknown drug-drug interactions while on inpatient medications, the Pharmacy and Therapeutics Committee does not permit the use of "herbal" or natural products of this type within [facility].   ACTION TAKEN: The pharmacy department is unable to verify this order at this time and your patient has been informed of this safety policy. Please reevaluate patient's clinical condition at discharge and address if the herbal or natural product(s) should be resumed at that time.   Eudelia Bunch, Pharm.D [phone] 03/25/2019 3:57 PM

## 2019-03-25 NOTE — Interval H&P Note (Signed)
History and Physical Interval Note:  03/25/2019 9:33 AM  Robyn Lambert  has presented today for surgery, with the diagnosis of Left shoulder cuff arthropathy.  The various methods of treatment have been discussed with the patient and family. After consideration of risks, benefits and other options for treatment, the patient has consented to  Procedure(s) with comments: REVERSE SHOULDER ARTHROPLASTY (Left) - with interscalene block as a surgical intervention.  The patient's history has been reviewed, patient examined, no change in status, stable for surgery.  I have reviewed the patient's chart and labs.  Questions were answered to the patient's satisfaction.     Augustin Schooling

## 2019-03-26 LAB — BASIC METABOLIC PANEL
Anion gap: 9 (ref 5–15)
BUN: 16 mg/dL (ref 8–23)
CO2: 23 mmol/L (ref 22–32)
Calcium: 8.8 mg/dL — ABNORMAL LOW (ref 8.9–10.3)
Chloride: 105 mmol/L (ref 98–111)
Creatinine, Ser: 0.53 mg/dL (ref 0.44–1.00)
GFR calc Af Amer: >60 mL/min (ref >60)
GFR calc non Af Amer: >60 mL/min (ref >60)
Glucose, Bld: 107 mg/dL — ABNORMAL HIGH (ref 70–99)
Potassium: 4.6 mmol/L (ref 3.5–5.1)
Sodium: 137 mmol/L (ref 135–145)

## 2019-03-26 LAB — HEMOGLOBIN AND HEMATOCRIT, BLOOD
HCT: 35.3 % — ABNORMAL LOW (ref 36.0–46.0)
Hemoglobin: 11.3 g/dL — ABNORMAL LOW (ref 12.0–15.0)

## 2019-03-26 NOTE — Progress Notes (Signed)
   Subjective:  Patient reports pain as mild to moderate.  Patient complaining of pretty significant headache last night.  Otherwise denies shoulder pain.  She states her block is still active.  She denies chest pain, shortness of breath, or nausea vomiting.  Objective:   VITALS:   Vitals:   03/25/19 2219 03/26/19 0207 03/26/19 0544 03/26/19 0846  BP: 137/77 120/63 123/69 (!) 108/53  Pulse: 95 94 91 95  Resp: 18 18 16 16   Temp: 98 F (36.7 C) 98.3 F (36.8 C) 98 F (36.7 C) 98.2 F (36.8 C)  TempSrc:  Oral Oral   SpO2: 97% 99% 96% 99%  Weight:      Height:        Intact pulses distally Incision: dressing C/D/I Continues to have deficient sensation in the radial nerve and axillary nerve secondary to nerve block.  Otherwise intact in the median and ulnar.  Lab Results  Component Value Date   WBC 5.2 03/22/2019   HGB 11.3 (L) 03/26/2019   HCT 35.3 (L) 03/26/2019   MCV 93.6 03/22/2019   PLT 258 03/22/2019   BMET    Component Value Date/Time   NA 137 03/26/2019 0232   K 4.6 03/26/2019 0232   CL 105 03/26/2019 0232   CO2 23 03/26/2019 0232   GLUCOSE 107 (H) 03/26/2019 0232   BUN 16 03/26/2019 0232   CREATININE 0.53 03/26/2019 0232   CALCIUM 8.8 (L) 03/26/2019 0232   GFRNONAA >60 03/26/2019 0232   GFRAA >60 03/26/2019 0232     Assessment/Plan: 1 Day Post-Op   Active Problems:   H/O total shoulder replacement, left   Advance diet Up with therapy -Sling at all times other than for ADLs and occupational therapy.  Nonweightbearing to the left upper extremity.  -Dressing changed to Aquacel today.  -Discharge home today with follow-up with Dr. Alma Friendly in 2 weeks.   Nicholes Stairs 03/26/2019, 10:01 AM   Geralynn Rile, MD [phone]

## 2019-03-26 NOTE — Evaluation (Signed)
Occupational Therapy Evaluation Patient Details Name: Robyn Lambert MRN: [id number] DOB: 1941-05-09 Today's Date: 03/26/2019    History of Present Illness This 78 y.o. female admitted for reverse TSA   Clinical Impression   Patient evaluated by Occupational Therapy with no further acute OT needs identified. All education has been completed and the patient has no further questions. All education completed - see details below.  She will have several caregivers, and was able to teach back or return demonstration of all info and should be able to instruct caregivers.  Handouts provided.  See below for any follow-up Occupational Therapy or equipment needs. OT is signing off. Thank you for this referral.      Follow Up Recommendations  Follow surgeon's recommendation for DC plan and follow-up therapies    Equipment Recommendations  None recommended by OT    Recommendations for Other Services       Precautions / Restrictions Precautions Precautions: Shoulder Type of Shoulder Precautions: A/PROM shoulder flexion to 90; abd 60; ER 30; sling for comfort and sleep  Shoulder Interventions: For comfort;Off for dressing/bathing/exercises;Shoulder sling/immobilizer Precaution Booklet Issued: Yes (comment) Precaution Comments: reviewed handouts, exercises and precautions  Restrictions Weight Bearing Restrictions: Yes LUE Weight Bearing: Non weight bearing      Mobility Bed Mobility Overal bed mobility: Modified Independent             General bed mobility comments: pt reports she will sleep in recliner   Transfers Overall transfer level: Needs assistance   Transfers: Stand Pivot Transfers;Sit to/from Stand Sit to Stand: Supervision Stand pivot transfers: Supervision            Balance Overall balance assessment: No apparent balance deficits (not formally assessed)                                         ADL either performed or assessed with clinical  judgement   ADL Overall ADL's : Needs assistance/impaired Eating/Feeding: Set up;Sitting;Bed level   Grooming: Wash/dry hands;Wash/dry face;Oral care;Brushing hair;Supervision/safety;Standing   Upper Body Bathing: Supervision/ safety;Sitting   Lower Body Bathing: Minimal assistance;Sit to/from stand   Upper Body Dressing : Minimal assistance;Sitting   Lower Body Dressing: Minimal assistance;Sit to/from stand   Toilet Transfer: Supervision/safety;Ambulation;Comfort height toilet   Toileting- Clothing Manipulation and Hygiene: Minimal assistance;Sit to/from stand       Functional mobility during ADLs: Supervision/safety       Vision         Perception     Praxis      Pertinent Vitals/Pain Pain Assessment: Faces Faces Pain Scale: Hurts little more Pain Location: headache  Pain Descriptors / Indicators: Headache Pain Intervention(s): Monitored during session     Hand Dominance Right   Extremity/Trunk Assessment Upper Extremity Assessment Upper Extremity Assessment: RUE deficits/detail;LUE deficits/detail RUE Deficits / Details: h/o Rt RCR, but WFL  LUE Deficits / Details: elbow, wrist and hand still numb from block            Communication Communication Communication: No difficulties   Cognition Arousal/Alertness: Awake/alert Behavior During Therapy: WFL for tasks assessed/performed Overall Cognitive Status: Within Functional Limits for tasks assessed                                     General Comments  reviewed all precautions, safety with ADLs,  and sling management with pt as well as HEP.  She was able to teach back info indicating that she can instruct caregivers     Exercises Exercises: Shoulder Shoulder Exercises Shoulder Flexion: AAROM;Left;5 reps;Supine Shoulder ABduction: AAROM;Left;5 reps;Seated Elbow Flexion: AAROM;Left;Seated;10 reps Elbow Extension: AAROM;Left;Standing;10 reps Wrist Flexion: AAROM;Left;Seated;10  reps Wrist Extension: AAROM;Left;10 reps;Seated Digit Composite Flexion: AAROM;Left;10 reps;Seated Composite Extension: AAROM;Left;10 reps;Seated Neck Flexion: AROM;5 reps;Seated Neck Extension: AROM;5 reps;Seated Neck Lateral Flexion - Right: AROM;5 reps;Seated Neck Lateral Flexion - Left: AROM;5 reps;Seated   Shoulder Instructions Shoulder Instructions Donning/doffing shirt without moving shoulder: Patient able to independently direct caregiver Method for sponge bathing under operated UE: Patient able to independently direct caregiver Donning/doffing sling/immobilizer: Patient able to independently direct caregiver Correct positioning of sling/immobilizer: Patient able to independently direct caregiver ROM for elbow, wrist and digits of operated UE: Minimal assistance;Patient able to independently direct caregiver Sling wearing schedule (on at all times/off for ADL's): Patient able to independently direct caregiver Proper positioning of operated UE when showering: Patient able to independently direct caregiver;Independent Positioning of UE while sleeping: Independent;Patient able to independently direct caregiver    Home Living Family/patient expects to be discharged to:: Private residence Living Arrangements: Alone Available Help at Discharge: Family;Available PRN/intermittently;Available 24 hours/day Type of Home: House Home Access: Stairs to enter [employer] of Steps: 2   Home Layout: One level     Bathroom Shower/Tub: Walk-in shower             Additional Comments: Pt reports different family members will be assisting her as needed.  Great grand daughter will be picking her up, but daughter will be staying with her this pm       Prior Functioning/Environment Level of Independence: Independent                 OT Problem List: Decreased range of motion;Decreased knowledge of use of DME or AE;Decreased knowledge of precautions;Impaired UE functional  use;Pain      OT Treatment/Interventions:      OT Goals(Current goals can be found in the care plan section) Acute Rehab OT Goals Patient Stated Goal: to go home today OT Goal Formulation: All assessment and education complete, DC therapy  OT Frequency:     Barriers to D/C:            Co-evaluation              AM-PAC OT "6 Clicks" Daily Activity     Outcome Measure Help from another person eating meals?: A Little Help from another person taking care of personal grooming?: A Little Help from another person toileting, which includes using toliet, bedpan, or urinal?: A Little Help from another person bathing (including washing, rinsing, drying)?: A Little Help from another person to put on and taking off regular upper body clothing?: A Little Help from another person to put on and taking off regular lower body clothing?: A Little 6 Click Score: 18   End of Session Nurse Communication: Mobility status  Activity Tolerance: Patient tolerated treatment well Patient left: in bed;with call bell/phone within reach  OT Visit Diagnosis: Pain Pain - Right/Left: Left Pain - part of body: Shoulder;Arm                Time: 4268-3419 OT Time Calculation (min): 39 min Charges:     Lucille Passy, OTR/L [place] Pager [phone] Office [place], [place] 03/26/2019, 12:27 PM

## 2019-03-26 NOTE — Progress Notes (Signed)
Pt discharged to home. DC instructions given. No concerns voiced. Left unit in wheelchair pushed by Nurse tech. Left in stable condition.  VWilliams, Therapist, sports.

## 2019-03-28 ENCOUNTER — Encounter (HOSPITAL_COMMUNITY): Payer: Self-pay | Admitting: Orthopedic Surgery

## 2019-03-29 NOTE — Discharge Summary (Signed)
Orthopedic Discharge Summary        Physician Discharge Summary  Patient ID: Robyn Lambert MRN: [id number] DOB/AGE: 77-Nov-1942 77 y.o.  Admit date: 03/25/2019 Discharge date: 03/29/2019   Procedures:  Procedure(s) (LRB): REVERSE SHOULDER ARTHROPLASTY (Left)  Attending Physician:  Dr. Esmond Plants  Admission Diagnoses:  Left shoulder cuff arthropathy   Discharge Diagnoses:  Left shoulder cuff arthropathy   Past Medical History:  Diagnosis Date  . Anxiety   . Arthritis   . Dyspnea    REPORTS THIS IS ON EXERTION ONLY , "SOMETIMES BY THE TIME I WALK UPHILL ON MY DRIVEAWAY, MY HEART IS POUNDING AND I AM OUT OF BREATH "   . Dysrhythmia    hx PACs   . GERD (gastroesophageal reflux disease)   . Hyperlipidemia   . Joint pain    MULTIPLE ARTHRITIC JOINTS  . Palpitations    DAILY , DESCIRBES AS "RACING" DENIES ASSOCIATED SOB, DIZZINESS, NOR CHEST PAIN   . Rectocele   . Thyroid disease     PCP: Ernestene Kiel, MD   Discharged Condition: good  Hospital Course:  Patient underwent the above stated procedure on 03/25/2019. Patient tolerated the procedure well and brought to the recovery room in good condition and subsequently to the floor. Patient had an uncomplicated hospital course and was stable for discharge.   Disposition:  with follow up in 2 weeks   Follow-up Information    Netta Cedars, MD. Call in 2 weeks.   Specialty: Orthopedic Surgery Why: [phone] Contact information: [address] [place] [address] [phone]           Discharge Instructions    Call MD / Call 911   Complete by: As directed    If you experience chest pain or shortness of breath, CALL 911 and be transported to the hospital emergency room.  If you develope a fever above 101 F, pus (white drainage) or increased drainage or redness at the wound, or calf pain, call your surgeon's office.   Constipation Prevention   Complete by: As directed    Drink plenty of  fluids.  Prune juice may be helpful.  You may use a stool softener, such as Colace (over the counter) 100 mg twice a day.  Use MiraLax (over the counter) for constipation as needed.   Diet - low sodium heart healthy   Complete by: As directed    Increase activity slowly as tolerated   Complete by: As directed       Allergies as of 03/26/2019      Reactions   Sulfa Antibiotics Hives      Medication List    STOP taking these medications   bisacodyl 10 MG suppository Commonly known as: DULCOLAX   metoCLOPramide 5 MG tablet Commonly known as: REGLAN   oxyCODONE 5 MG immediate release tablet Commonly known as: Oxy IR/ROXICODONE     TAKE these medications   acetaminophen 500 MG tablet Commonly known as: TYLENOL Take 500-1,000 mg by mouth every 6 (six) hours as needed for moderate pain or headache.   b complex vitamins tablet Take 1 tablet by mouth daily.   CALCIUM MAGNESIUM PO Take 2 tablets by mouth daily.   diphenhydrAMINE 12.5 MG/5ML elixir Commonly known as: BENADRYL Take 5-10 mLs (12.5-25 mg total) by mouth every 4 (four) hours as needed for itching.   docusate sodium 100 MG capsule Commonly known as: COLACE Take 1 capsule (100 mg total) by mouth 2 (two) times daily. HOLD  for loose stool or diarrhea.   Fish Oil 1200 MG Caps Take 1,200 mg by mouth daily.   GLUCOSAMINE CHONDROITIN JOINT PO Take 2 tablets by mouth daily.   levothyroxine 75 MCG tablet Commonly known as: SYNTHROID Take 75 mcg by mouth daily before breakfast.   Melatonin 10 MG Tabs Take 10 mg by mouth daily.   methocarbamol 500 MG tablet Commonly known as: ROBAXIN Take 1 tablet (500 mg total) by mouth every 6 (six) hours as needed for muscle spasms.   multivitamin with minerals Tabs tablet Take 1 tablet by mouth daily.   ondansetron 4 MG tablet Commonly known as: ZOFRAN Take 1 tablet (4 mg total) by mouth every 6 (six) hours as needed for nausea.   OVER THE COUNTER MEDICATION Take 1  tablet by mouth daily. Holy Basil   PSYLLIUM HUSK PO Take 3 capsules by mouth at bedtime.   QC VITAMIN C WITH ROSE HIPS PO Take by mouth daily.   Red Yeast Rice 600 MG Tabs Take 600 mg by mouth daily.   rivaroxaban 10 MG Tabs tablet Commonly known as: XARELTO Take 1 tablet (10 mg total) by mouth daily with breakfast. Take Xarelto for two and a half more weeks, then discontinue Xarelto. Once the patient has completed the Xarelto, they may resume the 81 mg Aspirin.   traMADol 50 MG tablet Commonly known as: ULTRAM Take 1-2 tablets (50-100 mg total) by mouth every 6 (six) hours as needed (mild pain). What changed: Another medication with the same name was added. Make sure you understand how and when to take each.   traMADol 50 MG tablet Commonly known as: Ultram Take 1 tablet (50 mg total) by mouth every 6 (six) hours as needed for up to 5 days for moderate pain or severe pain. What changed: You were already taking a medication with the same name, and this prescription was added. Make sure you understand how and when to take each.         Signed: Ventura Bruns 03/29/2019, 8:02 AM  [facility] is now [facility] [address], [place], [place], [place] [address] Phone: [place]

## 2019-08-24 DIAGNOSIS — E2839 Other primary ovarian failure: Secondary | ICD-10-CM | POA: Diagnosis not present

## 2019-08-24 DIAGNOSIS — M810 Age-related osteoporosis without current pathological fracture: Secondary | ICD-10-CM | POA: Diagnosis not present

## 2019-08-31 DIAGNOSIS — Z6833 Body mass index (BMI) 33.0-33.9, adult: Secondary | ICD-10-CM | POA: Diagnosis not present

## 2019-08-31 DIAGNOSIS — M810 Age-related osteoporosis without current pathological fracture: Secondary | ICD-10-CM | POA: Diagnosis not present

## 2019-08-31 DIAGNOSIS — Z1231 Encounter for screening mammogram for malignant neoplasm of breast: Secondary | ICD-10-CM | POA: Diagnosis not present

## 2019-09-07 DIAGNOSIS — M25561 Pain in right knee: Secondary | ICD-10-CM | POA: Diagnosis not present

## 2019-09-07 DIAGNOSIS — M25461 Effusion, right knee: Secondary | ICD-10-CM | POA: Diagnosis not present

## 2019-09-13 DIAGNOSIS — N6011 Diffuse cystic mastopathy of right breast: Secondary | ICD-10-CM | POA: Diagnosis not present

## 2019-09-13 DIAGNOSIS — N6012 Diffuse cystic mastopathy of left breast: Secondary | ICD-10-CM | POA: Diagnosis not present

## 2019-09-14 DIAGNOSIS — M25561 Pain in right knee: Secondary | ICD-10-CM | POA: Diagnosis not present

## 2019-09-27 DIAGNOSIS — M1711 Unilateral primary osteoarthritis, right knee: Secondary | ICD-10-CM | POA: Diagnosis not present

## 2019-09-27 DIAGNOSIS — M25561 Pain in right knee: Secondary | ICD-10-CM | POA: Diagnosis not present

## 2019-10-20 DIAGNOSIS — E039 Hypothyroidism, unspecified: Secondary | ICD-10-CM | POA: Diagnosis not present

## 2019-11-10 DIAGNOSIS — M25561 Pain in right knee: Secondary | ICD-10-CM | POA: Diagnosis not present

## 2019-11-10 DIAGNOSIS — M1711 Unilateral primary osteoarthritis, right knee: Secondary | ICD-10-CM | POA: Diagnosis not present

## 2019-11-18 DIAGNOSIS — M25561 Pain in right knee: Secondary | ICD-10-CM | POA: Diagnosis not present

## 2019-11-18 DIAGNOSIS — M1711 Unilateral primary osteoarthritis, right knee: Secondary | ICD-10-CM | POA: Diagnosis not present

## 2019-11-22 DIAGNOSIS — M5441 Lumbago with sciatica, right side: Secondary | ICD-10-CM | POA: Diagnosis not present

## 2019-11-22 DIAGNOSIS — R300 Dysuria: Secondary | ICD-10-CM | POA: Diagnosis not present

## 2019-11-22 DIAGNOSIS — W19XXXA Unspecified fall, initial encounter: Secondary | ICD-10-CM | POA: Diagnosis not present

## 2019-11-25 DIAGNOSIS — M1711 Unilateral primary osteoarthritis, right knee: Secondary | ICD-10-CM | POA: Diagnosis not present

## 2020-01-06 DIAGNOSIS — M1711 Unilateral primary osteoarthritis, right knee: Secondary | ICD-10-CM | POA: Diagnosis not present

## 2020-02-06 DIAGNOSIS — K5641 Fecal impaction: Secondary | ICD-10-CM | POA: Diagnosis not present

## 2020-02-08 DIAGNOSIS — Z79899 Other long term (current) drug therapy: Secondary | ICD-10-CM | POA: Diagnosis not present

## 2020-02-08 DIAGNOSIS — Z01818 Encounter for other preprocedural examination: Secondary | ICD-10-CM | POA: Diagnosis not present

## 2020-02-08 DIAGNOSIS — M1711 Unilateral primary osteoarthritis, right knee: Secondary | ICD-10-CM | POA: Diagnosis not present

## 2020-02-08 NOTE — Patient Instructions (Addendum)
DUE TO COVID-19 ONLY ONE VISITOR IS ALLOWED TO COME WITH YOU AND STAY IN THE WAITING ROOM ONLY DURING PRE OP AND PROCEDURE DAY OF SURGERY. THE 2 VISITORS MAY VISIT WITH YOU AFTER SURGERY IN YOUR PRIVATE ROOM DURING VISITING HOURS ONLY!  YOU NEED TO HAVE A COVID 19 TEST ON_7/8______ @_2 :30______, THIS TEST MUST BE DONE BEFORE SURGERY, COME  [address], [place] , [address].  ([place]) ONCE YOUR COVID TEST IS COMPLETED, PLEASE BEGIN THE QUARANTINE INSTRUCTIONS AS OUTLINED IN YOUR HANDOUT.                Eliseo Squires   Your procedure is scheduled on: 02/20/20   Report to [facility] Main  Entrance   Report to admitting at  5:30 AM     Call this number if you have problems the morning of surgery [phone]   . BRUSH YOUR TEETH MORNING OF SURGERY AND RINSE YOUR MOUTH OUT, NO CHEWING GUM CANDY OR MINTS.   Do not eat food After Midnight.   YOU MAY HAVE CLEAR LIQUIDS FROM MIDNIGHT UNTIL 4:30 AM  . At 4:30 AM Please finish the prescribed Pre-Surgery drink  . Nothing by mouth after you finish the  drink !   Take these medicines the morning of surgery with A SIP OF WATER: Levothyroxine                                You may not have any metal on your body including hair pins and              piercings  Do not wear jewelry, make-up, lotions, powders or perfumes, deodorant             Do not wear nail polish on your fingernails.            Do not shave  48 hours prior to surgery.                 Do not bring valuables to the hospital. [place].  Contacts, dentures or bridgework may not be worn into surgery.      Special Instructions: N/A              Please read over the following fact sheets you were given: _____________________________________________________________________             [facility] - Preparing for Surgery Before surgery, you can play an important role.   Because skin is not  sterile, your skin needs to be as free of germs as possible.   You can reduce the number of germs on your skin by washing with CHG (chlorahexidine gluconate) soap before surgery.   CHG is an antiseptic cleaner which kills germs and bonds with the skin to continue killing germs even after washing. Please DO NOT use if you have an allergy to CHG or antibacterial soaps.   If your skin becomes reddened/irritated stop using the CHG and inform your nurse when you arrive at Short Stay. Do not shave (including legs and underarms) for at least 48 hours prior to the first CHG shower.   . Please follow these instructions carefully:  1.  Shower with CHG Soap the night before surgery and the  morning of Surgery.  2.  If you choose to wash  your hair, wash your hair first as usual with your  normal  shampoo.  3.  After you shampoo, rinse your hair and body thoroughly to remove the  shampoo.                                        4.  Use CHG as you would any other liquid soap.  You can apply chg directly  to the skin and wash                       Gently with a scrungie or clean washcloth.  5.  Apply the CHG Soap to your body ONLY FROM THE NECK DOWN.   Do not use on face/ open                           Wound or open sores. Avoid contact with eyes, ears mouth and genitals (private parts).                       Wash face,  Genitals (private parts) with your normal soap.             6.  Wash thoroughly, paying special attention to the area where your surgery  will be performed.  7.  Thoroughly rinse your body with warm water from the neck down.  8.  DO NOT shower/wash with your normal soap after using and rinsing off  the CHG Soap.             9.  Pat yourself dry with a clean towel.            10.  Wear clean pajamas.            11.  Place clean sheets on your bed the night of your first shower and do not  sleep with pets. Day of Surgery : Do not apply any lotions/deodorants the morning of surgery.  Please wear  clean clothes to the hospital/surgery center.  FAILURE TO FOLLOW THESE INSTRUCTIONS MAY RESULT IN THE CANCELLATION OF YOUR SURGERY PATIENT SIGNATURE_________________________________  NURSE SIGNATURE__________________________________  ________________________________________________________________________   [name]  An incentive spirometer is a tool that can help keep your lungs clear and active. This tool measures how well you are filling your lungs with each breath. Taking long deep breaths may help reverse or decrease the chance of developing breathing (pulmonary) problems (especially infection) following:  A long period of time when you are unable to move or be active. BEFORE THE PROCEDURE   If the spirometer includes an indicator to show your best effort, your nurse or respiratory therapist will set it to a desired goal.  If possible, sit up straight or lean slightly forward. Try not to slouch.  Hold the incentive spirometer in an upright position. INSTRUCTIONS FOR USE  1. Sit on the edge of your bed if possible, or sit up as far as you can in bed or on a chair. 2. Hold the incentive spirometer in an upright position. 3. Breathe out normally. 4. Place the mouthpiece in your mouth and seal your lips tightly around it. 5. Breathe in slowly and as deeply as possible, raising the piston or the ball toward the top of the column. 6. Hold your breath for 3-5 seconds or for as long as possible. Allow  the piston or ball to fall to the bottom of the column. 7. Remove the mouthpiece from your mouth and breathe out normally. 8. Rest for a few seconds and repeat Steps 1 through 7 at least 10 times every 1-2 hours when you are awake. Take your time and take a few normal breaths between deep breaths. 9. The spirometer may include an indicator to show your best effort. Use the indicator as a goal to work toward during each repetition. 10. After each set of 10 deep breaths, practice  coughing to be sure your lungs are clear. If you have an incision (the cut made at the time of surgery), support your incision when coughing by placing a pillow or rolled up towels firmly against it. Once you are able to get out of bed, walk around indoors and cough well. You may stop using the incentive spirometer when instructed by your caregiver.  RISKS AND COMPLICATIONS  Take your time so you do not get dizzy or light-headed.  If you are in pain, you may need to take or ask for pain medication before doing incentive spirometry. It is harder to take a deep breath if you are having pain. AFTER USE  Rest and breathe slowly and easily.  It can be helpful to keep track of a log of your progress. Your caregiver can provide you with a simple table to help with this. If you are using the spirometer at home, follow these instructions: [place] IF:   You are having difficultly using the spirometer.  You have trouble using the spirometer as often as instructed.  Your pain medication is not giving enough relief while using the spirometer.  You develop fever of 100.5 F (38.1 C) or higher. SEEK IMMEDIATE MEDICAL CARE IF:   You cough up bloody sputum that had not been present before.  You develop fever of 102 F (38.9 C) or greater.  You develop worsening pain at or near the incision site. MAKE SURE YOU:   Understand these instructions.  Will watch your condition.  Will get help right away if you are not doing well or get worse. Document Released: 12/08/2006 Document Revised: 10/20/2011 Document Reviewed: 02/08/2007 [facility] Patient Information 2014 [place], [place].   ________________________________________________________________________

## 2020-02-09 ENCOUNTER — Encounter (HOSPITAL_COMMUNITY)
Admission: RE | Admit: 2020-02-09 | Discharge: 2020-02-09 | Disposition: A | Discharge status: Home / Self Care | Payer: Medicare PPO | Source: Ambulatory Visit | Attending: Orthopedic Surgery | Admitting: Orthopedic Surgery

## 2020-02-09 ENCOUNTER — Other Ambulatory Visit: Payer: Self-pay

## 2020-02-09 ENCOUNTER — Encounter (HOSPITAL_COMMUNITY): Payer: Self-pay

## 2020-02-09 DIAGNOSIS — M1711 Unilateral primary osteoarthritis, right knee: Secondary | ICD-10-CM | POA: Diagnosis not present

## 2020-02-09 DIAGNOSIS — E785 Hyperlipidemia, unspecified: Secondary | ICD-10-CM | POA: Diagnosis not present

## 2020-02-09 DIAGNOSIS — Z79899 Other long term (current) drug therapy: Secondary | ICD-10-CM | POA: Insufficient documentation

## 2020-02-09 DIAGNOSIS — Z85828 Personal history of other malignant neoplasm of skin: Secondary | ICD-10-CM | POA: Insufficient documentation

## 2020-02-09 DIAGNOSIS — Z79890 Hormone replacement therapy (postmenopausal): Secondary | ICD-10-CM | POA: Diagnosis not present

## 2020-02-09 DIAGNOSIS — Z01812 Encounter for preprocedural laboratory examination: Secondary | ICD-10-CM | POA: Insufficient documentation

## 2020-02-09 DIAGNOSIS — E039 Hypothyroidism, unspecified: Secondary | ICD-10-CM | POA: Diagnosis not present

## 2020-02-09 HISTORY — DX: Hypothyroidism, unspecified: E03.9

## 2020-02-09 HISTORY — DX: Malignant (primary) neoplasm, unspecified: C80.1

## 2020-02-09 LAB — COMPREHENSIVE METABOLIC PANEL
ALT: 14 U/L (ref 0–44)
AST: 19 U/L (ref 15–41)
Albumin: 3.9 g/dL (ref 3.5–5.0)
Alkaline Phosphatase: 91 U/L (ref 38–126)
Anion gap: 9 (ref 5–15)
BUN: 13 mg/dL (ref 8–23)
CO2: 29 mmol/L (ref 22–32)
Calcium: 9.2 mg/dL (ref 8.9–10.3)
Chloride: 103 mmol/L (ref 98–111)
Creatinine, Ser: 0.65 mg/dL (ref 0.44–1.00)
GFR calc Af Amer: >60 mL/min (ref >60)
GFR calc non Af Amer: >60 mL/min (ref >60)
Glucose, Bld: 113 mg/dL — ABNORMAL HIGH (ref 70–99)
Potassium: 4.3 mmol/L (ref 3.5–5.1)
Sodium: 141 mmol/L (ref 135–145)
Total Bilirubin: 0.4 mg/dL (ref 0.3–1.2)
Total Protein: 6.9 g/dL (ref 6.5–8.1)

## 2020-02-09 LAB — PROTIME-INR
INR: 0.9 (ref 0.8–1.2)
Prothrombin Time: 11.9 seconds (ref 11.4–15.2)

## 2020-02-09 LAB — CBC
HCT: 38.6 % (ref 36.0–46.0)
Hemoglobin: 12.2 g/dL (ref 12.0–15.0)
MCH: 29.1 pg (ref 26.0–34.0)
MCHC: 31.6 g/dL (ref 30.0–36.0)
MCV: 92.1 fL (ref 80.0–100.0)
Platelets: 226 10*3/uL (ref 150–400)
RBC: 4.19 MIL/uL (ref 3.87–5.11)
RDW: 13.4 % (ref 11.5–15.5)
WBC: 4.7 10*3/uL (ref 4.0–10.5)
nRBC: 0 % (ref 0.0–0.2)

## 2020-02-09 LAB — APTT: aPTT: 35 seconds (ref 24–36)

## 2020-02-09 LAB — SURGICAL PCR SCREEN
MRSA, PCR: NEGATIVE
Staphylococcus aureus: NEGATIVE

## 2020-02-09 NOTE — Progress Notes (Signed)
COVID Vaccine Completed:No Date COVID Vaccine completed: COVID vaccine manufacturer: Pfizer    Moderna   [employer]   PCP - Dr. C.Prochnau Cardiologist - no  Chest x-ray - no EKG - 02/08/20 sent to Dr. Peri Maris office Stress Test - no ECHO - no Cardiac Cath - 2014  Sleep Study - no CPAP -   Fasting Blood Sugar - NA Checks Blood Sugar _____ times a day  Blood Thinner Instructions:NA Aspirin Instructions: Last Dose:  Anesthesia review:   Patient denies shortness of breath, fever, cough and chest pain at PAT appointment yes   Patient verbalized understanding of instructions that were given to them at the PAT appointment. Patient was also instructed that they will need to review over the PAT instructions again at home before surgery.  Yes  Pt's activity has dropped because of Covid and she feels" out of shape" She gets winded climbing stairs sometimes but not with ADLs.

## 2020-02-10 NOTE — Progress Notes (Signed)
Anesthesia Chart Review:   Case: [id number] Date/Time: 02/20/20 0700   Procedure: TOTAL KNEE ARTHROPLASTY (Right Knee) - 63min   Anesthesia type: Choice   Pre-op diagnosis: Right knee osteoarthritis   [facility] ROOM 09 / [facility]   Surgeons: Gaynelle Arabian, MD      DISCUSSION: Pt is a 79 year old with hx palpitations/PACs (negative Holter monitor 2019), unspecified "aorta surgery" in 1971  EKG and last office visit note requested from PCP office.    VS: BP (!) 159/73   Pulse 78   Temp 36.5 C (Oral)   Resp 18   Ht 5\' 4"  (1.626 m)   Wt 88.2 kg   SpO2 100%   BMI 33.36 kg/m    PROVIDERS: - PCP is Ernestene Kiel, MD   LABS: Labs reviewed: Acceptable for surgery. (all labs ordered are listed, but only abnormal results are displayed)  Labs Reviewed  COMPREHENSIVE METABOLIC PANEL - Abnormal; Notable for the following components:      Result Value   Glucose, Bld 113 (*)    All other components within normal limits  SURGICAL PCR SCREEN  APTT  CBC  PROTIME-INR  TYPE AND SCREEN    EKG: pending   CV: Holter monitor 03/10/18: Unremarkable Holter monitoring.    Past Medical History:  Diagnosis Date  . Anxiety   . Arthritis   . 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 Dyspnea    REPORTS THIS IS ON EXERTION ONLY , "SOMETIMES BY THE TIME I WALK UPHILL ON MY DRIVEAWAY, MY HEART IS POUNDING AND I AM OUT OF BREATH "   . Dysrhythmia    hx PACs   . GERD (gastroesophageal reflux disease)   . Hyperlipidemia   . Hypothyroidism   . Joint pain    MULTIPLE ARTHRITIC JOINTS  . Palpitations    DAILY , DESCIRBES AS "RACING" DENIES ASSOCIATED SOB, DIZZINESS, NOR CHEST PAIN   . Rectocele   . Thyroid disease     Past Surgical History:  Procedure Laterality Date  . AORTA SURGERY  1971   TRAUMA, 5 1/2 HOUR SURGERY  TO REPAIR THORACIC AORTA. F/U WITH THORACIC PHYSICIAN FOR 1 YEAR POST-OP   . CARDIAC CATHERIZATION   2014   DONE IN [facility] ? . PER PATIENT , NORMAL  CATH , CATH WAS ORDERED BY DR Bettina Gavia  [CARDIOLOGY ]  . CATARACT EXTRACTION, BILATERAL     (L) 10-03-2014 , (R) 12-9--2019 , DR.FAKADEJ, DR BEVIS   . CHOLECYSTECTOMY    . FINGER SURGERY     RT THUMB SURG  . HAMMER TOE SURGERY    . LIVER SURGERY  1971   TRAUMA  . MANDIBLE SURGERY  1971   TRAUMA  . REVERSE SHOULDER ARTHROPLASTY Left 03/25/2019   Procedure: REVERSE SHOULDER ARTHROPLASTY;  Surgeon: Netta Cedars, MD;  Location: [facility];  Service: Orthopedics;  Laterality: Left;  with interscalene block  . ROTATOR CUFF REPAIR Right    DR CISCO IN [place]   . TOTAL KNEE ARTHROPLASTY Left 02/28/2015   Procedure: LEFT TOTAL KNEE ARTHROPLASTY;  Surgeon: Gaynelle Arabian, MD;  [facility];  Service: Orthopedics;  Laterality: Left;    MEDICATIONS: . acetaminophen (TYLENOL) 650 MG CR tablet  . Ascorbic Acid (VITAMIN C) 1000 MG tablet  . BIOTIN PO  . CALCIUM PO  . Cholecalciferol (VITAMIN D3) 125 MCG (5000 UT) TABS  . Glucosamine-Chondroitin (COSAMIN DS PO)  . levothyroxine (SYNTHROID) 88 MCG tablet  . LINOLEIC  ACID-SUNFLOWER OIL PO  . Melatonin 10 MG TABS  . Multiple Vitamin (MULTIVITAMIN WITH MINERALS) TABS tablet  . NAT-RUL PSYLLIUM SEED HUSKS PO  . Omega-3 Fatty Acids (FISH OIL PO)  . OVER THE COUNTER MEDICATION  . polyethylene glycol (MIRALAX / GLYCOLAX) 17 g packet  . Probiotic Product (PROBIOTIC PO)  . QUERCETIN PO  . Red Yeast Rice 600 MG TABS  . Specialty Vitamins Products (BRAIN) TABS  . traMADol (ULTRAM) 50 MG tablet  . Vitamin D-Vitamin K (K2 PLUS D3 PO)  . Zinc 25 MG TABS   No current facility-administered medications for this encounter.    Robyn Cass, FNP-BC [facility]/Anesthesiology Phone: [phone] 02/10/2020 4:07 PM

## 2020-02-15 NOTE — H&P (Signed)
TOTAL KNEE ADMISSION H&P  Patient is being admitted for right total knee arthroplasty.  Subjective:  Chief Complaint: Right knee pain.  HPI: Robyn Lambert, 79 y.o. female has a history of pain and functional disability in the right knee due to arthritis and has failed non-surgical conservative treatments for greater than 12 weeks to include corticosteriod injections and activity modification. Onset of symptoms was gradual, starting several years ago with gradually worsening course since that time. The patient noted no past surgery on the right knee.  Patient currently rates pain in the right knee at 7 out of 10 with activity. Patient has worsening of pain with activity and weight bearing, crepitus and joint swelling. Patient has evidence of severe bone-on-bone arthritis in the lateral compartment with patellofemoral narrowing by imaging studies. There is no active infection.  Patient Active Problem List   Diagnosis Date Noted  . 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 OA (osteoarthritis) of knee 02/28/2015    Past Medical History:  Diagnosis Date  . Anxiety   . Arthritis   . 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 Dyspnea    REPORTS THIS IS ON EXERTION ONLY , "SOMETIMES BY THE TIME I WALK UPHILL ON MY DRIVEAWAY, MY HEART IS POUNDING AND I AM OUT OF BREATH "   . Dysrhythmia    hx PACs   . GERD (gastroesophageal reflux disease)   . Hyperlipidemia   . Hypothyroidism   . Joint pain    MULTIPLE ARTHRITIC JOINTS  . Palpitations    DAILY , DESCIRBES AS "RACING" DENIES ASSOCIATED SOB, DIZZINESS, NOR CHEST PAIN   . Rectocele   . Thyroid disease     Past Surgical History:  Procedure Laterality Date  . AORTA SURGERY  1971   TRAUMA, 5 1/2 HOUR SURGERY  TO REPAIR THORACIC AORTA. F/U WITH THORACIC PHYSICIAN FOR 1 YEAR POST-OP   . CARDIAC CATHERIZATION   2014   DONE IN [facility] ? . PER PATIENT , NORMAL CATH , CATH WAS ORDERED BY DR Bettina Gavia  [CARDIOLOGY ]  . CATARACT  EXTRACTION, BILATERAL     (L) 10-03-2014 , (R) 12-9--2019 , DR.FAKADEJ, DR BEVIS   . CHOLECYSTECTOMY    . FINGER SURGERY     RT THUMB SURG  . HAMMER TOE SURGERY    . LIVER SURGERY  1971   TRAUMA  . MANDIBLE SURGERY  1971   TRAUMA  . REVERSE SHOULDER ARTHROPLASTY Left 03/25/2019   Procedure: REVERSE SHOULDER ARTHROPLASTY;  Surgeon: Netta Cedars, MD;  [facility];  Service: Orthopedics;  Laterality: Left;  with interscalene block  . ROTATOR CUFF REPAIR Right    DR CISCO IN [place]   . TOTAL KNEE ARTHROPLASTY Left 02/28/2015   Procedure: LEFT TOTAL KNEE ARTHROPLASTY;  Surgeon: Gaynelle Arabian, MD;  [facility];  Service: Orthopedics;  Laterality: Left;    Prior to Admission medications   Medication Sig Start Date End Date Taking? Authorizing Provider  acetaminophen (TYLENOL) 650 MG CR tablet Take 650-1,300 mg by mouth every 8 (eight) hours as needed for pain.   Yes [provider]  Ascorbic Acid (VITAMIN C) 1000 MG tablet Take 1,000 mg by mouth daily.   Yes [provider]  BIOTIN PO Take 1 capsule by mouth daily.   Yes [provider]  CALCIUM PO Take 3,000 mg by mouth at bedtime. 1000 mg/capsule   Yes [provider]  Cholecalciferol (VITAMIN D3) 125 MCG (5000 UT) TABS  Take 5,000 Units by mouth daily.   Yes [provider]  Glucosamine-Chondroitin (COSAMIN DS PO) Take 2 tablets by mouth daily.   Yes [provider]  levothyroxine (SYNTHROID) 88 MCG tablet Take 88 mcg by mouth every evening. 12/26/19  Yes [provider]  LINOLEIC ACID-SUNFLOWER OIL PO Take 2,400 mg by mouth at bedtime. 1200 mg/capsule   Yes [provider]  Melatonin 10 MG TABS Take 10 mg by mouth at bedtime.    Yes [provider]  Multiple Vitamin (MULTIVITAMIN WITH MINERALS) TABS tablet Take 1 tablet by mouth daily.   Yes [provider]  NAT-RUL PSYLLIUM SEED HUSKS PO Take 3 capsules by mouth at bedtime.   Yes [provider]  Omega-3 Fatty Acids (FISH OIL PO) Take 2,500 mg by mouth at bedtime.   Yes [provider]  OVER THE COUNTER MEDICATION Take 2 tablets by mouth daily as needed (anxiety.). [place] Basil    Yes [provider]  polyethylene glycol (MIRALAX / GLYCOLAX) 17 g packet Take 8.5 g by mouth daily.   Yes [provider]  Probiotic Product (PROBIOTIC PO) Take 1 capsule by mouth daily after breakfast.   Yes [provider]  QUERCETIN PO Take 500 mg by mouth daily.   Yes [provider]  Specialty Vitamins Products (BRAIN) TABS Take 2 tablets by mouth daily. Curamed Brain   Yes [provider]  traMADol (ULTRAM) 50 MG tablet Take 1-2 tablets (50-100 mg total) by mouth every 6 (six) hours as needed (mild pain). Patient taking differently: Take 50 mg by mouth at bedtime.  03/02/15  Yes Perkins, Alexzandrew L, PA-C  Vitamin D-Vitamin K (K2 PLUS D3 PO) Take 1 tablet by mouth daily.   Yes [provider]  Zinc 25 MG TABS Take 25 mg by mouth daily.   Yes [provider]  Red Yeast Rice 600 MG TABS Take 600 mg by mouth daily.    [provider]    Allergies  Allergen Reactions  . Sulfa Antibiotics Hives    Social History   Socioeconomic History  . Marital status: Widowed    Spouse name: Not on file  . Number of children: Not on file  . Years of education: Not on file  . Highest education level: Not on file  Occupational History  . Not on file  Tobacco Use  . Smoking status: Never Smoker  . Smokeless tobacco: Never Used  Vaping Use  . Vaping Use: Never used  Substance and Sexual Activity  . Alcohol use: No  . Drug use: No  . Sexual activity: Not on file  Other Topics Concern  . Not on file  Social History Narrative  . Not on file   Social Determinants of Health   Financial Resource Strain:   . Difficulty of Paying Living Expenses:   Food Insecurity:   . Worried About Charity fundraiser in the Last  Year:   . Arboriculturist in the Last Year:   Transportation Needs:   . Film/video editor (Medical):   Marland Kitchen Lack of Transportation (Non-Medical):   Physical Activity:   . Days of Exercise per Week:   . Minutes of Exercise per Session:   Stress:   . Feeling of Stress :   Social Connections:   . Frequency of Communication with Friends and Family:   . Frequency of Social Gatherings with Friends and Family:   . Attends Religious Services:   .  Active Member of Clubs or Organizations:   . Attends Archivist Meetings:   Marland Kitchen Marital Status:   Intimate Partner Violence:   . Fear of Current or Ex-Partner:   . Emotionally Abused:   Marland Kitchen Physically Abused:   . Sexually Abused:       Tobacco Use: Low Risk   . Smoking Tobacco Use: Never Smoker  . Smokeless Tobacco Use: Never Used   Social History   Substance and Sexual Activity  Alcohol Use No    No family history on file.  Review of Systems  Constitutional: Negative for chills and fever.  HENT: Negative for congestion, sore throat and tinnitus.   Eyes: Negative for double vision, photophobia and pain.  Respiratory: Negative for cough, shortness of breath and wheezing.   Cardiovascular: Negative for chest pain, palpitations and orthopnea.  Gastrointestinal: Negative for heartburn, nausea and vomiting.  Genitourinary: Negative for dysuria, frequency and urgency.  Musculoskeletal: Positive for joint pain.  Neurological: Negative for dizziness, weakness and headaches.    Objective:  Physical Exam: Well nourished and well developed.  General: Alert and oriented x3, cooperative and pleasant, no acute distress.  Head: normocephalic, atraumatic, neck supple.  Eyes: EOMI.  Respiratory: breath sounds clear in all fields, no wheezing, rales, or rhonchi. Cardiovascular: Regular rate and rhythm, no murmurs, gallops or rubs.  Abdomen: non-tender to palpation and soft, normoactive bowel sounds. Musculoskeletal:  Right Knee  Exam:  Slight effusion present.  Slight valgus deformity.  The range of motion is: 5 to 110 degrees.  Mild crepitus on range of motion of the knee.  Positive medial greater than lateral joint line tenderness.  The knee is stable.  Calves soft and nontender. Motor function intact in LE. Strength 5/5 LE bilaterally. Neuro: Distal pulses 2+. Sensation to light touch intact in LE.  Imaging Review Plain radiographs demonstrate severe degenerative joint disease of the right knee. The overall alignment is mild valgus. The bone quality appears to be adequate for age and reported activity level.  Assessment/Plan:  End stage arthritis, right knee   The patient history, physical examination, clinical judgment of the provider and imaging studies are consistent with end stage degenerative joint disease of the right knee and total knee arthroplasty is deemed medically necessary. The treatment options including medical management, injection therapy arthroscopy and arthroplasty were discussed at length. The risks and benefits of total knee arthroplasty were presented and reviewed. The risks due to aseptic loosening, infection, stiffness, patella tracking problems, thromboembolic complications and other imponderables were discussed. The patient acknowledged the explanation, agreed to proceed with the plan and consent was signed. Patient is being admitted for inpatient treatment for surgery, pain control, PT, OT, prophylactic antibiotics, VTE prophylaxis, progressive ambulation and ADLs and discharge planning. The patient is planning to be discharged home.  Patient's anticipated LOS is less than 2 midnights, meeting these requirements: - Lives within 1 hour of care - Has a competent adult at home to recover with post-op recover - NO history of  - Chronic pain requiring opiods  - Diabetes  - Coronary Artery Disease  - Heart failure  - Heart attack  - Stroke  - DVT/VTE  - Cardiac arrhythmia  -  Respiratory Failure/COPD  - Renal failure  - Anemia  - Advanced Liver disease  Therapy Plans: Outpatient therapy at [facility] Disposition: Home with daughter Planned DVT Prophylaxis: Aspirin 325 mg BID DME Needed: None PCP: Ernestene Kiel, MD (clearance received) TXA: IV Allergies: Sulfa (rash) Anesthesia Concerns: None  BMI: 33.1 Last HgbA1c: Not diabetic  Other: Issues with constipation  - Patient was instructed on what medications to stop prior to surgery. - Follow-up visit in 2 weeks with Dr. Wynelle Link - Begin physical therapy following surgery - Pre-operative lab work as pre-surgical testing - Prescriptions will be provided in hospital at time of discharge  Theresa Duty, PA-C Orthopedic Surgery [facility] [place]

## 2020-02-16 ENCOUNTER — Other Ambulatory Visit (HOSPITAL_COMMUNITY)
Admission: RE | Admit: 2020-02-16 | Discharge: 2020-02-16 | Disposition: A | Discharge status: Home / Self Care | Payer: Medicare PPO | Source: Ambulatory Visit | Attending: Orthopedic Surgery | Admitting: Orthopedic Surgery

## 2020-02-16 DIAGNOSIS — Z01812 Encounter for preprocedural laboratory examination: Secondary | ICD-10-CM | POA: Insufficient documentation

## 2020-02-16 DIAGNOSIS — Z20822 Contact with and (suspected) exposure to covid-19: Secondary | ICD-10-CM | POA: Insufficient documentation

## 2020-02-16 LAB — SARS CORONAVIRUS 2 (TAT 6-24 HRS): SARS Coronavirus 2: NEGATIVE

## 2020-02-19 MED ORDER — BUPIVACAINE LIPOSOME 1.3 % IJ SUSP
20.0000 mL | Freq: Once | INTRAMUSCULAR | Status: DC
  Filled 2020-02-19: qty 20

## 2020-02-19 NOTE — Anesthesia Preprocedure Evaluation (Addendum)
Anesthesia Evaluation  Patient identified by MRN, date of birth, ID band Patient awake    Reviewed: Allergy & Precautions, NPO status , Patient's Chart, lab work & pertinent test results  Airway Mallampati: II  TM Distance: >3 FB Neck ROM: Full    Dental no notable dental hx. (+) Dental Advisory Given, Missing,    Pulmonary shortness of breath and with exertion,    Pulmonary exam normal breath sounds clear to auscultation       Cardiovascular Normal cardiovascular exam+ dysrhythmias (palpitations, PACs on holter)  Rhythm:Regular Rate:Normal     Neuro/Psych negative neurological ROS  negative psych ROS   GI/Hepatic Neg liver ROS, GERD  ,  Endo/Other  Hypothyroidism Obesity BMI 33  Renal/GU negative Renal ROS  negative genitourinary   Musculoskeletal  (+) Arthritis , Osteoarthritis,    Abdominal Normal abdominal exam  (+)   Peds  Hematology negative hematology ROS (+) hct 38.6, plt 226   Anesthesia Other Findings   Reproductive/Obstetrics negative OB ROS                           Anesthesia Physical Anesthesia Plan  ASA: II  Anesthesia Plan: Spinal, Regional and MAC   Post-op Pain Management:  Regional for Post-op pain   Induction:   PONV Risk Score and Plan: 2 and Propofol infusion and TIVA  Airway Management Planned: Natural Airway and Nasal Cannula  Additional Equipment: None  Intra-op Plan:   Post-operative Plan:   Informed Consent: I have reviewed the patients History and Physical, chart, labs and discussed the procedure including the risks, benefits and alternatives for the proposed anesthesia with the patient or authorized representative who has indicated his/her understanding and acceptance.       Plan Discussed with: CRNA  Anesthesia Plan Comments:        Anesthesia Quick Evaluation

## 2020-02-20 ENCOUNTER — Ambulatory Visit (HOSPITAL_COMMUNITY): Payer: Medicare PPO | Admitting: Certified Registered"

## 2020-02-20 ENCOUNTER — Ambulatory Visit (HOSPITAL_COMMUNITY)
Admission: RE | Admit: 2020-02-20 | Discharge: 2020-02-21 | Disposition: A | Discharge status: Home / Self Care | Payer: Medicare PPO | Attending: Orthopedic Surgery | Admitting: Orthopedic Surgery

## 2020-02-20 ENCOUNTER — Other Ambulatory Visit: Payer: Self-pay

## 2020-02-20 ENCOUNTER — Encounter (HOSPITAL_COMMUNITY): Payer: Self-pay | Admitting: Orthopedic Surgery

## 2020-02-20 ENCOUNTER — Encounter (HOSPITAL_COMMUNITY)
Admission: RE | Disposition: A | Discharge status: Home / Self Care | Payer: Self-pay | Source: Home / Self Care | Attending: Orthopedic Surgery

## 2020-02-20 ENCOUNTER — Ambulatory Visit (HOSPITAL_COMMUNITY): Payer: Medicare PPO | Admitting: Emergency Medicine

## 2020-02-20 DIAGNOSIS — Z96612 Presence of left artificial shoulder joint: Secondary | ICD-10-CM | POA: Diagnosis not present

## 2020-02-20 DIAGNOSIS — R002 Palpitations: Secondary | ICD-10-CM | POA: Insufficient documentation

## 2020-02-20 DIAGNOSIS — Z882 Allergy status to sulfonamides status: Secondary | ICD-10-CM | POA: Insufficient documentation

## 2020-02-20 DIAGNOSIS — Z9049 Acquired absence of other specified parts of digestive tract: Secondary | ICD-10-CM | POA: Diagnosis not present

## 2020-02-20 DIAGNOSIS — E669 Obesity, unspecified: Secondary | ICD-10-CM | POA: Insufficient documentation

## 2020-02-20 DIAGNOSIS — G8918 Other acute postprocedural pain: Secondary | ICD-10-CM | POA: Diagnosis not present

## 2020-02-20 DIAGNOSIS — K219 Gastro-esophageal reflux disease without esophagitis: Secondary | ICD-10-CM | POA: Insufficient documentation

## 2020-02-20 DIAGNOSIS — F419 Anxiety disorder, unspecified: Secondary | ICD-10-CM | POA: Insufficient documentation

## 2020-02-20 DIAGNOSIS — R0602 Shortness of breath: Secondary | ICD-10-CM | POA: Insufficient documentation

## 2020-02-20 DIAGNOSIS — M1711 Unilateral primary osteoarthritis, right knee: Secondary | ICD-10-CM | POA: Diagnosis not present

## 2020-02-20 DIAGNOSIS — Z79899 Other long term (current) drug therapy: Secondary | ICD-10-CM | POA: Diagnosis not present

## 2020-02-20 DIAGNOSIS — E785 Hyperlipidemia, unspecified: Secondary | ICD-10-CM | POA: Insufficient documentation

## 2020-02-20 DIAGNOSIS — Z6833 Body mass index (BMI) 33.0-33.9, adult: Secondary | ICD-10-CM | POA: Insufficient documentation

## 2020-02-20 DIAGNOSIS — I491 Atrial premature depolarization: Secondary | ICD-10-CM | POA: Diagnosis not present

## 2020-02-20 DIAGNOSIS — E039 Hypothyroidism, unspecified: Secondary | ICD-10-CM | POA: Diagnosis not present

## 2020-02-20 HISTORY — PX: TOTAL KNEE ARTHROPLASTY: SHX125

## 2020-02-20 LAB — TYPE AND SCREEN
ABO/RH(D): O POS
Antibody Screen: NEGATIVE

## 2020-02-20 SURGERY — ARTHROPLASTY, KNEE, TOTAL
Anesthesia: Monitor Anesthesia Care | Site: Knee | Laterality: Right

## 2020-02-20 MED ORDER — ONDANSETRON HCL 4 MG PO TABS: 4.0000 mg | ORAL_TABLET | Freq: Four times a day (QID) | ORAL | Status: DC | PRN

## 2020-02-20 MED ORDER — CEFAZOLIN SODIUM-DEXTROSE 2-4 GM/100ML-% IV SOLN
2.0000 g | Freq: Four times a day (QID) | INTRAVENOUS | Status: AC
  Administered 2020-02-20 (×2): 2 g via INTRAVENOUS
  Filled 2020-02-20 (×2): qty 100

## 2020-02-20 MED ORDER — DEXAMETHASONE SODIUM PHOSPHATE 10 MG/ML IJ SOLN
10.0000 mg | Freq: Once | INTRAMUSCULAR | Status: AC
  Administered 2020-02-21: 10 mg via INTRAVENOUS
  Filled 2020-02-20: qty 1

## 2020-02-20 MED ORDER — SODIUM CHLORIDE (PF) 0.9 % IJ SOLN
INTRAMUSCULAR | Status: AC
  Filled 2020-02-20: qty 10

## 2020-02-20 MED ORDER — METOCLOPRAMIDE HCL 5 MG PO TABS: 5.0000 mg | ORAL_TABLET | Freq: Three times a day (TID) | ORAL | Status: DC | PRN

## 2020-02-20 MED ORDER — ACETAMINOPHEN 500 MG PO TABS: 1000.0000 mg | ORAL_TABLET | Freq: Once | ORAL | Status: DC

## 2020-02-20 MED ORDER — DIPHENHYDRAMINE HCL 12.5 MG/5ML PO ELIX: 12.5000 mg | ORAL_SOLUTION | ORAL | Status: DC | PRN

## 2020-02-20 MED ORDER — MIDAZOLAM HCL 2 MG/2ML IJ SOLN
INTRAMUSCULAR | Status: AC
  Filled 2020-02-20: qty 2

## 2020-02-20 MED ORDER — POVIDONE-IODINE 10 % EX SWAB
2.0000 "application " | Freq: Once | CUTANEOUS | Status: AC
  Administered 2020-02-20: 2 via TOPICAL

## 2020-02-20 MED ORDER — FENTANYL CITRATE (PF) 100 MCG/2ML IJ SOLN
25.0000 ug | INTRAMUSCULAR | Status: DC | PRN
  Administered 2020-02-20 (×2): 50 ug via INTRAVENOUS

## 2020-02-20 MED ORDER — GABAPENTIN 100 MG PO CAPS
100.0000 mg | ORAL_CAPSULE | Freq: Three times a day (TID) | ORAL | Status: DC
  Administered 2020-02-20 – 2020-02-21 (×3): 100 mg via ORAL
  Filled 2020-02-20 (×3): qty 1

## 2020-02-20 MED ORDER — FENTANYL CITRATE (PF) 100 MCG/2ML IJ SOLN
INTRAMUSCULAR | Status: AC
  Filled 2020-02-20: qty 2

## 2020-02-20 MED ORDER — LACTATED RINGERS IV SOLN: INTRAVENOUS | Status: DC

## 2020-02-20 MED ORDER — PROPOFOL 500 MG/50ML IV EMUL
INTRAVENOUS | Status: DC | PRN
  Administered 2020-02-20: 25 ug/kg/min via INTRAVENOUS

## 2020-02-20 MED ORDER — DOCUSATE SODIUM 100 MG PO CAPS
100.0000 mg | ORAL_CAPSULE | Freq: Two times a day (BID) | ORAL | Status: DC
  Administered 2020-02-20 – 2020-02-21 (×2): 100 mg via ORAL
  Filled 2020-02-20 (×2): qty 1

## 2020-02-20 MED ORDER — FLEET ENEMA 7-19 GM/118ML RE ENEM: 1.0000 | ENEMA | Freq: Once | RECTAL | Status: DC | PRN

## 2020-02-20 MED ORDER — MEPIVACAINE HCL (PF) 2 % IJ SOLN
INTRAMUSCULAR | Status: DC | PRN
  Administered 2020-02-20: 3 mL via EPIDURAL

## 2020-02-20 MED ORDER — ACETAMINOPHEN 10 MG/ML IV SOLN
1000.0000 mg | Freq: Four times a day (QID) | INTRAVENOUS | Status: DC
  Administered 2020-02-20: 1000 mg via INTRAVENOUS
  Filled 2020-02-20: qty 100

## 2020-02-20 MED ORDER — FENTANYL CITRATE (PF) 100 MCG/2ML IJ SOLN
INTRAMUSCULAR | Status: AC
  Administered 2020-02-20: 50 ug via INTRAVENOUS
  Filled 2020-02-20: qty 2

## 2020-02-20 MED ORDER — MORPHINE SULFATE (PF) 2 MG/ML IV SOLN: 0.5000 mg | INTRAVENOUS | Status: DC | PRN

## 2020-02-20 MED ORDER — OXYCODONE HCL 5 MG/5ML PO SOLN: 5.0000 mg | Freq: Once | ORAL | Status: AC | PRN

## 2020-02-20 MED ORDER — EPHEDRINE 5 MG/ML INJ
INTRAVENOUS | Status: AC
  Filled 2020-02-20: qty 10

## 2020-02-20 MED ORDER — MIDAZOLAM HCL 2 MG/2ML IJ SOLN
INTRAMUSCULAR | Status: DC | PRN
  Administered 2020-02-20 (×2): 1 mg via INTRAVENOUS

## 2020-02-20 MED ORDER — TRANEXAMIC ACID-NACL 1000-0.7 MG/100ML-% IV SOLN
1000.0000 mg | INTRAVENOUS | Status: AC
  Administered 2020-02-20: 1000 mg via INTRAVENOUS
  Filled 2020-02-20: qty 100

## 2020-02-20 MED ORDER — ONDANSETRON HCL 4 MG/2ML IJ SOLN: 4.0000 mg | Freq: Four times a day (QID) | INTRAMUSCULAR | Status: DC | PRN

## 2020-02-20 MED ORDER — EPHEDRINE SULFATE-NACL 50-0.9 MG/10ML-% IV SOSY
PREFILLED_SYRINGE | INTRAVENOUS | Status: DC | PRN
  Administered 2020-02-20 (×2): 5 mg via INTRAVENOUS

## 2020-02-20 MED ORDER — ONDANSETRON HCL 4 MG/2ML IJ SOLN
INTRAMUSCULAR | Status: DC | PRN
  Administered 2020-02-20: 4 mg via INTRAVENOUS

## 2020-02-20 MED ORDER — PHENOL 1.4 % MT LIQD: 1.0000 | OROMUCOSAL | Status: DC | PRN

## 2020-02-20 MED ORDER — LEVOTHYROXINE SODIUM 88 MCG PO TABS: 88.0000 ug | ORAL_TABLET | Freq: Every evening | ORAL | Status: DC

## 2020-02-20 MED ORDER — ROPIVACAINE HCL 5 MG/ML IJ SOLN
INTRAMUSCULAR | Status: DC | PRN
Start: 2020-02-20 — End: 2020-02-20
  Administered 2020-02-20: 30 mL via PERINEURAL

## 2020-02-20 MED ORDER — DEXAMETHASONE SODIUM PHOSPHATE 10 MG/ML IJ SOLN
INTRAMUSCULAR | Status: AC
  Filled 2020-02-20: qty 1

## 2020-02-20 MED ORDER — DEXAMETHASONE SODIUM PHOSPHATE 10 MG/ML IJ SOLN
INTRAMUSCULAR | Status: DC | PRN
  Administered 2020-02-20: 10 mg

## 2020-02-20 MED ORDER — TRAMADOL HCL 50 MG PO TABS: 50.0000 mg | ORAL_TABLET | Freq: Four times a day (QID) | ORAL | Status: DC | PRN

## 2020-02-20 MED ORDER — SODIUM CHLORIDE 0.9 % IR SOLN
Status: DC | PRN
  Administered 2020-02-20: 1000 mL

## 2020-02-20 MED ORDER — SODIUM CHLORIDE (PF) 0.9 % IJ SOLN
INTRAMUSCULAR | Status: DC | PRN
  Administered 2020-02-20: 60 mL

## 2020-02-20 MED ORDER — ORAL CARE MOUTH RINSE: 15.0000 mL | Freq: Once | OROMUCOSAL | Status: AC

## 2020-02-20 MED ORDER — CEFAZOLIN SODIUM-DEXTROSE 2-4 GM/100ML-% IV SOLN
2.0000 g | INTRAVENOUS | Status: AC
  Administered 2020-02-20: 2 g via INTRAVENOUS
  Filled 2020-02-20: qty 100

## 2020-02-20 MED ORDER — FENTANYL CITRATE (PF) 100 MCG/2ML IJ SOLN
INTRAMUSCULAR | Status: DC | PRN
  Administered 2020-02-20 (×2): 50 ug via INTRAVENOUS

## 2020-02-20 MED ORDER — SODIUM CHLORIDE 0.9 % IV SOLN: INTRAVENOUS | Status: DC

## 2020-02-20 MED ORDER — OXYCODONE HCL 5 MG PO TABS
5.0000 mg | ORAL_TABLET | Freq: Once | ORAL | Status: AC | PRN
  Administered 2020-02-20: 5 mg via ORAL

## 2020-02-20 MED ORDER — DEXAMETHASONE SODIUM PHOSPHATE 10 MG/ML IJ SOLN
8.0000 mg | Freq: Once | INTRAMUSCULAR | Status: AC
  Administered 2020-02-20: 8 mg via INTRAVENOUS

## 2020-02-20 MED ORDER — CHLORHEXIDINE GLUCONATE 0.12 % MT SOLN
15.0000 mL | Freq: Once | OROMUCOSAL | Status: AC
  Administered 2020-02-20: 15 mL via OROMUCOSAL

## 2020-02-20 MED ORDER — METHOCARBAMOL 500 MG IVPB - SIMPLE MED
INTRAVENOUS | Status: AC
  Filled 2020-02-20: qty 50

## 2020-02-20 MED ORDER — ONDANSETRON HCL 4 MG/2ML IJ SOLN: 4.0000 mg | Freq: Once | INTRAMUSCULAR | Status: DC | PRN

## 2020-02-20 MED ORDER — METHOCARBAMOL 500 MG PO TABS
500.0000 mg | ORAL_TABLET | Freq: Four times a day (QID) | ORAL | Status: DC | PRN
  Administered 2020-02-21: 500 mg via ORAL
  Filled 2020-02-20: qty 1

## 2020-02-20 MED ORDER — SODIUM CHLORIDE (PF) 0.9 % IJ SOLN
INTRAMUSCULAR | Status: AC
  Filled 2020-02-20: qty 50

## 2020-02-20 MED ORDER — PROPOFOL 10 MG/ML IV BOLUS
INTRAVENOUS | Status: AC
  Filled 2020-02-20: qty 40

## 2020-02-20 MED ORDER — POLYETHYLENE GLYCOL 3350 17 G PO PACK: 17.0000 g | PACK | Freq: Every day | ORAL | Status: DC | PRN

## 2020-02-20 MED ORDER — OXYCODONE HCL 5 MG PO TABS
5.0000 mg | ORAL_TABLET | ORAL | Status: DC | PRN
  Administered 2020-02-20 – 2020-02-21 (×4): 10 mg via ORAL
  Filled 2020-02-20 (×3): qty 2
  Filled 2020-02-20 (×2): qty 1

## 2020-02-20 MED ORDER — METHOCARBAMOL 500 MG IVPB - SIMPLE MED
500.0000 mg | Freq: Four times a day (QID) | INTRAVENOUS | Status: DC | PRN
  Administered 2020-02-20: 500 mg via INTRAVENOUS
  Filled 2020-02-20: qty 50

## 2020-02-20 MED ORDER — ACETAMINOPHEN 500 MG PO TABS
1000.0000 mg | ORAL_TABLET | Freq: Four times a day (QID) | ORAL | Status: AC
  Administered 2020-02-20 – 2020-02-21 (×4): 1000 mg via ORAL
  Filled 2020-02-20 (×4): qty 2

## 2020-02-20 MED ORDER — OXYCODONE HCL 5 MG PO TABS
ORAL_TABLET | ORAL | Status: AC
  Filled 2020-02-20: qty 1

## 2020-02-20 MED ORDER — ASPIRIN EC 325 MG PO TBEC
325.0000 mg | DELAYED_RELEASE_TABLET | Freq: Two times a day (BID) | ORAL | Status: DC
  Administered 2020-02-21: 325 mg via ORAL
  Filled 2020-02-20: qty 1

## 2020-02-20 MED ORDER — MEPIVACAINE HCL (PF) 2 % IJ SOLN
INTRAMUSCULAR | Status: AC
  Filled 2020-02-20: qty 20

## 2020-02-20 MED ORDER — METOCLOPRAMIDE HCL 5 MG/ML IJ SOLN: 5.0000 mg | Freq: Three times a day (TID) | INTRAMUSCULAR | Status: DC | PRN

## 2020-02-20 MED ORDER — MENTHOL 3 MG MT LOZG: 1.0000 | LOZENGE | OROMUCOSAL | Status: DC | PRN

## 2020-02-20 MED ORDER — BISACODYL 10 MG RE SUPP: 10.0000 mg | Freq: Every day | RECTAL | Status: DC | PRN

## 2020-02-20 MED ORDER — LIDOCAINE 2% (20 MG/ML) 5 ML SYRINGE
INTRAMUSCULAR | Status: AC
  Filled 2020-02-20: qty 5

## 2020-02-20 MED ORDER — ONDANSETRON HCL 4 MG/2ML IJ SOLN
INTRAMUSCULAR | Status: AC
  Filled 2020-02-20: qty 2

## 2020-02-20 MED ORDER — BUPIVACAINE LIPOSOME 1.3 % IJ SUSP
INTRAMUSCULAR | Status: DC | PRN
  Administered 2020-02-20: 20 mL

## 2020-02-20 MED ORDER — STERILE WATER FOR IRRIGATION IR SOLN
Status: DC | PRN
  Administered 2020-02-20: 2000 mL

## 2020-02-20 SURGICAL SUPPLY — 54 items
BAG SPEC THK2 15X12 ZIP CLS (MISCELLANEOUS) ×1
BAG ZIPLOCK 12X15 (MISCELLANEOUS) ×3 IMPLANT
BLADE SAG 18X100X1.27 (BLADE) ×3 IMPLANT
BLADE SAW SGTL 11.0X1.19X90.0M (BLADE) ×3 IMPLANT
BLADE SURG SZ10 CARB STEEL (BLADE) ×6 IMPLANT
BNDG ELASTIC 6X5.8 VLCR STR LF (GAUZE/BANDAGES/DRESSINGS) ×3 IMPLANT
BOWL SMART MIX CTS (DISPOSABLE) ×3 IMPLANT
CEMENT HV SMART SET (Cement) ×6 IMPLANT
CEMENT TIBIA MBT SIZE 4 (Knees) IMPLANT
CLOSURE WOUND 1/2 X4 (GAUZE/BANDAGES/DRESSINGS) ×2
COVER SURGICAL LIGHT HANDLE (MISCELLANEOUS) ×3 IMPLANT
COVER WAND RF STERILE (DRAPES) IMPLANT
CUFF TOURN SGL QUICK 34 (TOURNIQUET CUFF) ×3
CUFF TRNQT CYL 34X4.125X (TOURNIQUET CUFF) ×1 IMPLANT
DECANTER SPIKE VIAL GLASS SM (MISCELLANEOUS) ×3 IMPLANT
DRAPE U-SHAPE 47X51 STRL (DRAPES) ×3 IMPLANT
DRSG AQUACEL AG ADV 3.5X10 (GAUZE/BANDAGES/DRESSINGS) ×3 IMPLANT
DURAPREP 26ML APPLICATOR (WOUND CARE) ×3 IMPLANT
ELECT REM PT RETURN 15FT ADLT (MISCELLANEOUS) ×3 IMPLANT
FEMUR SIGMA PS SZ 4.0N R (Femur) ×2 IMPLANT
GLOVE BIO SURGEON STRL SZ7 (GLOVE) ×3 IMPLANT
GLOVE BIO SURGEON STRL SZ8 (GLOVE) ×3 IMPLANT
GLOVE BIOGEL PI IND STRL 7.0 (GLOVE) ×1 IMPLANT
GLOVE BIOGEL PI IND STRL 8 (GLOVE) ×1 IMPLANT
GLOVE BIOGEL PI INDICATOR 7.0 (GLOVE) ×2
GLOVE BIOGEL PI INDICATOR 8 (GLOVE) ×2
GOWN STRL REUS W/TWL LRG LVL3 (GOWN DISPOSABLE) ×6 IMPLANT
HANDPIECE INTERPULSE COAX TIP (DISPOSABLE) ×3
HOLDER FOLEY CATH W/STRAP (MISCELLANEOUS) IMPLANT
IMMOBILIZER KNEE 20 (SOFTGOODS) ×3
IMMOBILIZER KNEE 20 THIGH 36 (SOFTGOODS) ×1 IMPLANT
IMMOBILIZER KNEE 22 UNIV (SOFTGOODS) ×2 IMPLANT
KIT TURNOVER KIT A (KITS) IMPLANT
MANIFOLD NEPTUNE II (INSTRUMENTS) ×3 IMPLANT
NS IRRIG 1000ML POUR BTL (IV SOLUTION) ×3 IMPLANT
PACK TOTAL KNEE CUSTOM (KITS) ×3 IMPLANT
PADDING CAST COTTON 6X4 STRL (CAST SUPPLIES) ×4 IMPLANT
PATELLA DOME PFC 38MM (Knees) ×2 IMPLANT
PENCIL SMOKE EVACUATOR (MISCELLANEOUS) IMPLANT
PIN STEINMAN FIXATION KNEE (PIN) ×2 IMPLANT
PLATE ROT INSERT 10MM SIZE 4 (Plate) ×2 IMPLANT
PROTECTOR NERVE ULNAR (MISCELLANEOUS) ×3 IMPLANT
SET HNDPC FAN SPRY TIP SCT (DISPOSABLE) ×1 IMPLANT
STRIP CLOSURE SKIN 1/2X4 (GAUZE/BANDAGES/DRESSINGS) ×4 IMPLANT
SUT MNCRL AB 4-0 PS2 18 (SUTURE) ×3 IMPLANT
SUT STRATAFIX 0 PDS 27 VIOLET (SUTURE) ×3
SUT VIC AB 2-0 CT1 27 (SUTURE) ×9
SUT VIC AB 2-0 CT1 TAPERPNT 27 (SUTURE) ×3 IMPLANT
SUTURE STRATFX 0 PDS 27 VIOLET (SUTURE) ×1 IMPLANT
TIBIA MBT CEMENT SIZE 4 (Knees) ×3 IMPLANT
TRAY FOLEY MTR SLVR 16FR STAT (SET/KITS/TRAYS/PACK) ×3 IMPLANT
WATER STERILE IRR 1000ML POUR (IV SOLUTION) ×6 IMPLANT
WRAP KNEE MAXI GEL POST OP (GAUZE/BANDAGES/DRESSINGS) ×3 IMPLANT
YANKAUER SUCT BULB TIP 10FT TU (MISCELLANEOUS) ×3 IMPLANT

## 2020-02-20 NOTE — Anesthesia Postprocedure Evaluation (Signed)
Anesthesia Post Note  Patient: Robyn Lambert  Procedure(s) Performed: TOTAL KNEE ARTHROPLASTY (Right Knee)     Patient location during evaluation: PACU Anesthesia Type: Regional, MAC and Spinal Level of consciousness: awake and alert and oriented Pain management: pain level controlled Vital Signs Assessment: post-procedure vital signs reviewed and stable Respiratory status: spontaneous breathing, nonlabored ventilation and respiratory function stable Cardiovascular status: blood pressure returned to baseline and stable Postop Assessment: no headache, no backache, spinal receding and patient able to bend at knees Anesthetic complications: no   No complications documented.  Last Vitals:  Vitals:   02/20/20 1000 02/20/20 1015  BP: 125/70 121/65  Pulse:  77  Resp: 15 (!) 22  Temp:    SpO2:  100%    Last Pain:  Vitals:   02/20/20 0900  TempSrc:   PainSc: 0-No pain                 Pervis Hocking

## 2020-02-20 NOTE — Anesthesia Procedure Notes (Signed)
Anesthesia Regional Block: Adductor canal block   Pre-Anesthetic Checklist: ,, timeout performed, Correct Patient, Correct Site, Correct Laterality, Correct Procedure, Correct Position, site marked, Risks and benefits discussed,  Surgical consent,  Pre-op evaluation,  At surgeon's request and post-op pain management  Laterality: Right  Prep: Maximum Sterile Barrier Precautions used, chloraprep       Needles:  Injection technique: Single-shot  Needle Type: Echogenic Stimulator Needle     Needle Length: 9cm  Needle Gauge: 22     Additional Needles:   Procedures:,,,, ultrasound used (permanent image in chart),,,,  Narrative:  Start time: 02/20/2020 6:50 AM End time: 02/20/2020 6:55 AM Injection made incrementally with aspirations every 5 mL.  Performed by: Personally  Anesthesiologist: Pervis Hocking, DO  Additional Notes: Monitors applied. No increased pain on injection. No increased resistance to injection. Injection made in 5cc increments. Good needle visualization. Patient tolerated procedure well.

## 2020-02-20 NOTE — Anesthesia Procedure Notes (Signed)
Spinal  Patient location during procedure: OR Start time: 02/20/2020 7:13 AM Staffing Performed: resident/CRNA  Anesthesiologist: Pervis Hocking, DO Resident/CRNA: Eben Burow, CRNA Preanesthetic Checklist Completed: patient identified, IV checked, site marked, risks and benefits discussed, surgical consent, monitors and equipment checked, pre-op evaluation and timeout performed Spinal Block Patient position: sitting Prep: DuraPrep and site prepped and draped Patient monitoring: heart rate, cardiac monitor, continuous pulse ox and blood pressure Approach: midline Location: L3-4 Injection technique: single-shot Needle Needle type: Pencan  Needle gauge: 24 G Needle length: 9 cm Assessment Sensory level: T4 Additional Notes Pt placed in sitting position, spinal kit expiration date checked and verified, + CSF, - heme, pt tolerated well. Dr Doroteo Glassman present and supervising throughout.

## 2020-02-20 NOTE — Discharge Instructions (Addendum)
 Robyn Aluisio, MD Total Joint Specialist [facility] [place] [address] , [place] [address] [phone]  TOTAL KNEE REPLACEMENT POSTOPERATIVE DIRECTIONS    Knee Rehabilitation, Guidelines Following Surgery  Results after knee surgery are often greatly improved when you follow the exercise, range of motion and muscle strengthening exercises prescribed by your doctor. Safety measures are also important to protect the knee from further injury. If any of these exercises cause you to have increased pain or swelling in your knee joint, decrease the amount until you are comfortable again and slowly increase them. If you have problems or questions, call your caregiver or physical therapist for advice.   BLOOD CLOT PREVENTION . Take a 325 mg Aspirin two times a day for three weeks following surgery. Then take an 81 mg Aspirin once a day for three weeks. Then discontinue Aspirin. . You may resume your vitamins/supplements upon discharge from the hospital. . Do not take any NSAIDs (Advil, Aleve, Ibuprofen, Meloxicam, etc.) until you have discontinued the 325 mg Aspirin.  HOME CARE INSTRUCTIONS  . Remove items at home which could result in a fall. This includes throw rugs or furniture in walking pathways.  . ICE to the affected knee as much as tolerated. Icing helps control swelling. If the swelling is well controlled you will be more comfortable and rehab easier. Continue to use ice on the knee for pain and swelling from surgery. You may notice swelling that will progress down to the foot and ankle. This is normal after surgery. Elevate the leg when you are not up walking on it.    . Continue to use the breathing machine which will help keep your temperature down. It is common for your temperature to cycle up and down following surgery, especially at night when you are not up moving around and exerting yourself. The breathing machine keeps your lungs expanded and your  temperature down. . Do not place pillow under the operative knee, focus on keeping the knee straight while resting  DIET You may resume your previous home diet once you are discharged from the hospital.  DRESSING / WOUND CARE / SHOWERING . Keep your bulky bandage on for 2 days. On the third post-operative day you may remove the Ace bandage and gauze. There is a waterproof adhesive bandage on your skin which will stay in place until your first follow-up appointment. Once you remove this you will not need to place another bandage . You may begin showering 3 days following surgery, but do not submerge the incision under water.  ACTIVITY For the first 5 days, the key is rest and control of pain and swelling . Do your home exercises twice a day starting on post-operative day 3. On the days you go to physical therapy, just do the home exercises once that day. . You should rest, ice and elevate the leg for 50 minutes out of every hour. Get up and walk/stretch for 10 minutes per hour. After 5 days you can increase your activity slowly as tolerated. . Walk with your walker as instructed. Use the walker until you are comfortable transitioning to a cane. Walk with the cane in the opposite hand of the operative leg. You may discontinue the cane once you are comfortable and walking steadily. . Avoid periods of inactivity such as sitting longer than an hour when not asleep. This helps prevent blood clots.  . You may discontinue the knee immobilizer once you are able to perform a straight   leg raise while lying down. . You may resume a sexual relationship in one month or when given the OK by your doctor.  . You may return to work once you are cleared by your doctor.  . Do not drive a car for 6 weeks or until released by your surgeon.  . Do not drive while taking narcotics.  TED HOSE STOCKINGS Wear the elastic stockings on both legs for three weeks following surgery during the day. You may remove them at night  for sleeping.  WEIGHT BEARING Weight bearing as tolerated with assist device (walker, cane, etc) as directed, use it as long as suggested by your surgeon or therapist, typically at least 4-6 weeks.  POSTOPERATIVE CONSTIPATION PROTOCOL Constipation - defined medically as fewer than three stools per week and severe constipation as less than one stool per week.  One of the most common issues patients have following surgery is constipation.  Even if you have a regular bowel pattern at home, your normal regimen is likely to be disrupted due to multiple reasons following surgery.  Combination of anesthesia, postoperative narcotics, change in appetite and fluid intake all can affect your bowels.  In order to avoid complications following surgery, here are some recommendations in order to help you during your recovery period.  . Colace (docusate) - Pick up an over-the-counter form of Colace or another stool softener and take twice a day as long as you are requiring postoperative pain medications.  Take with a full glass of water daily.  If you experience loose stools or diarrhea, hold the colace until you stool forms back up. If your symptoms do not get better within 1 week or if they get worse, check with your doctor. . Dulcolax (bisacodyl) - Pick up over-the-counter and take as directed by the product packaging as needed to assist with the movement of your bowels.  Take with a full glass of water.  Use this product as needed if not relieved by Colace only.  . MiraLax (polyethylene glycol) - Pick up over-the-counter to have on hand. MiraLax is a solution that will increase the amount of water in your bowels to assist with bowel movements.  Take as directed and can mix with a glass of water, juice, soda, coffee, or tea. Take if you go more than two days without a movement. Do not use MiraLax more than once per day. Call your doctor if you are still constipated or irregular after using this medication for 7 days  in a row.  If you continue to have problems with postoperative constipation, please contact the office for further assistance and recommendations.  If you experience "the worst abdominal pain ever" or develop nausea or vomiting, please contact the office immediatly for further recommendations for treatment.  ITCHING If you experience itching with your medications, try taking only a single pain pill, or even half a pain pill at a time.  You can also use Benadryl over the counter for itching or also to help with sleep.   MEDICATIONS See your medication summary on the "After Visit Summary" that the nursing staff will review with you prior to discharge.  You may have some home medications which will be placed on hold until you complete the course of blood thinner medication.  It is important for you to complete the blood thinner medication as prescribed by your surgeon.  Continue your approved medications as instructed at time of discharge.  PRECAUTIONS . If you experience chest pain or shortness of   breath - call 911 immediately for transfer to the hospital emergency department.  . If you develop a fever greater that 101 F, purulent drainage from wound, increased redness or drainage from wound, foul odor from the wound/dressing, or calf pain - CONTACT YOUR SURGEON.                                                   FOLLOW-UP APPOINTMENTS Make sure you keep all of your appointments after your operation with your surgeon and caregivers. You should call the office at the above phone number and make an appointment for approximately two weeks after the date of your surgery or on the date instructed by your surgeon outlined in the "After Visit Summary".  RANGE OF MOTION AND STRENGTHENING EXERCISES  Rehabilitation of the knee is important following a knee injury or an operation. After just a few days of immobilization, the muscles of the thigh which control the knee become weakened and shrink (atrophy). Knee  exercises are designed to build up the tone and strength of the thigh muscles and to improve knee motion. Often times heat used for twenty to thirty minutes before working out will loosen up your tissues and help with improving the range of motion but do not use heat for the first two weeks following surgery. These exercises can be done on a training (exercise) mat, on the floor, on a table or on a bed. Use what ever works the best and is most comfortable for you Knee exercises include:  . Leg Lifts - While your knee is still immobilized in a splint or cast, you can do straight leg raises. Lift the leg to 60 degrees, hold for 3 sec, and slowly lower the leg. Repeat 10-20 times 2-3 times daily. Perform this exercise against resistance later as your knee gets better.  . Quad and Hamstring Sets - Tighten up the muscle on the front of the thigh (Quad) and hold for 5-10 sec. Repeat this 10-20 times hourly. Hamstring sets are done by pushing the foot backward against an object and holding for 5-10 sec. Repeat as with quad sets.   Leg Slides: Lying on your back, slowly slide your foot toward your buttocks, bending your knee up off the floor (only go as far as is comfortable). Then slowly slide your foot back down until your leg is flat on the floor again.  Angel Wings: Lying on your back spread your legs to the side as far apart as you can without causing discomfort.  A rehabilitation program following serious knee injuries can speed recovery and prevent re-injury in the future due to weakened muscles. Contact your doctor or a physical therapist for more information on knee rehabilitation.   IF YOU ARE TRANSFERRED TO A SKILLED REHAB FACILITY If the patient is transferred to a skilled rehab facility following release from the hospital, a list of the current medications will be sent to the facility for the patient to continue.  When discharged from the skilled rehab facility, please have the facility set up the  patient's Home Health Physical Therapy prior to being released. Also, the skilled facility will be responsible for providing the patient with their medications at time of release from the facility to include their pain medication, the muscle relaxants, and their blood thinner medication. If the patient is still at the   rehab facility at time of the two week follow up appointment, the skilled rehab facility will also need to assist the patient in arranging follow up appointment in our office and any transportation needs.  MAKE SURE YOU:  . Understand these instructions.  . Get help right away if you are not doing well or get worse.   DENTAL ANTIBIOTICS:  In most cases prophylactic antibiotics for Dental procdeures after total joint surgery are not necessary.  Exceptions are as follows:  1. History of prior total joint infection  2. Severely immunocompromised (Organ Transplant, cancer chemotherapy, Rheumatoid biologic meds such as Humera)  3. Poorly controlled diabetes (A1C &gt; 8.0, blood glucose over 200)  If you have one of these conditions, contact your surgeon for an antibiotic prescription, prior to your dental procedure.    Pick up stool softner and laxative for home use following surgery while on pain medications. Do not submerge incision under water. Please use good hand washing techniques while changing dressing each day. May shower starting three days after surgery. Please use a clean towel to pat the incision dry following showers. Continue to use ice for pain and swelling after surgery. Do not use any lotions or creams on the incision until instructed by your surgeon.  

## 2020-02-20 NOTE — Anesthesia Procedure Notes (Signed)
Procedure Name: MAC Date/Time: 02/20/2020 7:09 AM Performed by: Eben Burow, CRNA Pre-anesthesia Checklist: Patient identified, Emergency Drugs available, Suction available, Patient being monitored and Timeout performed Oxygen Delivery Method: Simple face mask Placement Confirmation: positive ETCO2

## 2020-02-20 NOTE — Interval H&P Note (Signed)
History and Physical Interval Note:  02/20/2020 6:39 AM  Robyn Lambert  has presented today for surgery, with the diagnosis of Right knee osteoarthritis.  The various methods of treatment have been discussed with the patient and family. After consideration of risks, benefits and other options for treatment, the patient has consented to  Procedure(s) with comments: TOTAL KNEE ARTHROPLASTY (Right) - 36min as a surgical intervention.  The patient's history has been reviewed, patient examined, no change in status, stable for surgery.  I have reviewed the patient's chart and labs.  Questions were answered to the patient's satisfaction.     Pilar Plate Marcele Kosta

## 2020-02-20 NOTE — Transfer of Care (Signed)
Immediate Anesthesia Transfer of Care Note  Patient: Robyn Lambert  Procedure(s) Performed: TOTAL KNEE ARTHROPLASTY (Right Knee)  Patient Location: PACU  Anesthesia Type:Spinal  Level of Consciousness: awake, alert  and oriented  Airway & Oxygen Therapy: Patient Spontanous Breathing and Patient connected to face mask oxygen  Post-op Assessment: Report given to RN and Post -op Vital signs reviewed and stable  Post vital signs: Reviewed and stable  Last Vitals:  Vitals Value Taken Time  BP 103/59 02/20/20 0842  Temp    Pulse 79 02/20/20 0843  Resp 15 02/20/20 0843  SpO2 100 % 02/20/20 0843  Vitals shown include unvalidated device data.  Last Pain:  Vitals:   02/20/20 0603  TempSrc: Oral      Patients Stated Pain Goal: 5 (80/32/12 2482)  Complications: No complications documented.

## 2020-02-20 NOTE — Evaluation (Signed)
Physical Therapy Evaluation Patient Details Name: Robyn Lambert MRN: [id number] DOB: 02-28-1941 Today's Date: 02/20/2020   History of Present Illness  s/p R TKA. PMHx: L reverse TSA,  L TKA  Clinical Impression  Pt admitted with above diagnosis.  Pt amb ` 40' with RW and min assist. Initiated HEP. Anticipate steady prgress in acute setting   Pt currently with functional limitations due to the deficits listed below (see PT Problem List). Pt will benefit from skilled PT to increase their independence and safety with mobility to allow discharge to the venue listed below.       Follow Up Recommendations Follow surgeon's recommendation for DC plan and follow-up therapies    Equipment Recommendations  None recommended by PT    Recommendations for Other Services       Precautions / Restrictions Precautions Precautions: Knee;Fall Required Braces or Orthoses: Knee Immobilizer - Right Knee Immobilizer - Right: Discontinue once straight leg raise with < 10 degree lag Restrictions Weight Bearing Restrictions: No Other Position/Activity Restrictions: WBAT      Mobility  Bed Mobility Overal bed mobility: Needs Assistance Bed Mobility: Supine to Sit     Supine to sit: Min assist     General bed mobility comments: assist with RLE  Transfers Overall transfer level: Needs assistance Equipment used: Rolling walker (2 wheeled) Transfers: Sit to/from Stand Sit to Stand: Min assist         General transfer comment: cues for hand placement and RLE position  Ambulation/Gait Ambulation/Gait assistance: Min Web designer (Feet): 40 Feet Assistive device: Rolling walker (2 wheeled) Gait Pattern/deviations: Step-to pattern;Decreased stance time - right     General Gait Details: cues for sequence and RW distance from self  Stairs            Wheelchair Mobility    Modified Rankin (Stroke Patients Only)       Balance                                              Pertinent Vitals/Pain Pain Assessment: 0-10 Pain Score: 4  Pain Location: right knee Pain Descriptors / Indicators: Discomfort;Sore Pain Intervention(s): Limited activity within patient's tolerance;Monitored during session;Premedicated before session;Repositioned;Ice applied    Home Living Family/patient expects to be discharged to:: Private residence Living Arrangements: Alone Available Help at Discharge: Family;Available PRN/intermittently;Available 24 hours/day (dtr assisting x 1 wk) Type of Home: House Home Access: Stairs to enter   [employer] of Steps: 2 Home Layout: One level Home Equipment: Muddy - 2 wheels;Bedside commode;Toilet riser;Adaptive equipment;Other (comment);Grab bars - tub/shower (leg lifter)      Prior Function Level of Independence: Independent               Hand Dominance        Extremity/Trunk Assessment   Upper Extremity Assessment Upper Extremity Assessment: Overall WFL for tasks assessed;LUE deficits/detail LUE Deficits / Details: hx reverse TSA    Lower Extremity Assessment Lower Extremity Assessment: LLE deficits/detail RLE Deficits / Details: ankle WFL, knee extension and hip flexion 2+/5; knee flexion ~5 to 55 degrees LLE Deficits / Details: pt reported knee flexion to ~ 90 degrees       Communication   Communication: No difficulties  Cognition Arousal/Alertness: Awake/alert Behavior During Therapy: WFL for tasks assessed/performed Overall Cognitive Status: Within Functional Limits for tasks assessed  General Comments      Exercises Total Joint Exercises Ankle Circles/Pumps: AROM;Both;10 reps Quad Sets: 10 reps;Both;AROM   Assessment/Plan    PT Assessment Patient needs continued PT services  PT Problem List Decreased strength;Decreased mobility;Decreased range of motion;Decreased activity tolerance;Pain;Decreased knowledge of use of  DME       PT Treatment Interventions DME instruction;Therapeutic exercise;Gait training;Functional mobility training;Therapeutic activities;Patient/family education;Stair training    PT Goals (Current goals can be found in the Care Plan section)  Acute Rehab PT Goals Patient Stated Goal: home tomorrow, less knee pain PT Goal Formulation: With patient Time For Goal Achievement: 02/27/20 Potential to Achieve Goals: Good    Frequency 7X/week   Barriers to discharge        Co-evaluation               AM-PAC PT "6 Clicks" Mobility  Outcome Measure Help needed turning from your back to your side while in a flat bed without using bedrails?: A Little Help needed moving from lying on your back to sitting on the side of a flat bed without using bedrails?: A Little Help needed moving to and from a bed to a chair (including a wheelchair)?: A Little Help needed standing up from a chair using your arms (e.g., wheelchair or bedside chair)?: A Little Help needed to walk in hospital room?: A Little Help needed climbing 3-5 steps with a railing? : A Little 6 Click Score: 18    End of Session Equipment Utilized During Treatment: Gait belt;Right knee immobilizer Activity Tolerance: Patient tolerated treatment well Patient left: in chair;with call bell/phone within reach;with chair alarm set;with family/visitor present Nurse Communication: Mobility status PT Visit Diagnosis: Difficulty in walking, not elsewhere classified (R26.2)    Time: 4920-1007 PT Time Calculation (min) (ACUTE ONLY): 26 min   Charges:   PT Evaluation $PT Eval Low Complexity: 1 Low PT Treatments $Gait Training: 8-22 mins        Baxter Flattery, PT  Acute Rehab Dept ([place]) [phone] Pager [phone]  02/20/2020   [facility] 02/20/2020, 4:52 PM

## 2020-02-20 NOTE — Op Note (Signed)
OPERATIVE REPORT-TOTAL KNEE ARTHROPLASTY   Pre-operative diagnosis- Osteoarthritis  Right knee(s)  Post-operative diagnosis- Osteoarthritis Right knee(s)  Procedure-  Right  Total Knee Arthroplasty  Surgeon- Robyn Lambert. Dalton Mille, MD  Assistant- Theresa Duty, PA-C   Anesthesia-  Adductor canal block and spinal  EBL-50 mL   Drains Hemovac  Tourniquet time-  Total Tourniquet Time Documented: Thigh (Right) - 36 minutes Total: Thigh (Right) - 36 minutes     Complications- None  Condition-PACU - hemodynamically stable.   Brief Clinical Note  Robyn Lambert is a 79 y.o. year old female with end stage OA of her right knee with progressively worsening pain and dysfunction. She has constant pain, with activity and at rest and significant functional deficits with difficulties even with ADLs. She has had extensive non-op management including analgesics, injections of cortisone and viscosupplements, and home exercise program, but remains in significant pain with significant dysfunction.Radiographs show bone on bone arthritis lateral and patellofemoral. She presents now for right Total Knee Arthroplasty.    Procedure in detail---   The patient is brought into the operating room and positioned supine on the operating table. After successful administration of  Adductor canal block and spinal,   a tourniquet is placed high on the  Right thigh(s) and the lower extremity is prepped and draped in the usual sterile fashion. Time out is performed by the operating team and then the  Right lower extremity is wrapped in Esmarch, knee flexed and the tourniquet inflated to 300 mmHg.       A midline incision is made with a ten blade through the subcutaneous tissue to the level of the extensor mechanism. A fresh blade is used to make a medial parapatellar arthrotomy. Soft tissue over the proximal medial tibia is subperiosteally elevated to the joint line with a knife and into the semimembranosus bursa with a  Cobb elevator. Soft tissue over the proximal lateral tibia is elevated with attention being paid to avoiding the patellar tendon on the tibial tubercle. The patella is everted, knee flexed 90 degrees and the ACL and PCL are removed. Findings are bone on bone lateral and patellofemoral with large global osteophytes.        The drill is used to create a starting hole in the distal femur and the canal is thoroughly irrigated with sterile saline to remove the fatty contents. The 5 degree Right  valgus alignment guide is placed into the femoral canal and the distal femoral cutting block is pinned to remove 10 mm off the distal femur. Resection is made with an oscillating saw.      The tibia is subluxed forward and the menisci are removed. The extramedullary alignment guide is placed referencing proximally at the medial aspect of the tibial tubercle and distally along the second metatarsal axis and tibial crest. The block is pinned to remove 20mm off the more deficient lateral  side. Resection is made with an oscillating saw. Size 4is the most appropriate size for the tibia and the proximal tibia is prepared with the modular drill and keel punch for that size.      The femoral sizing guide is placed and size 4 is most appropriate. Rotation is marked off the epicondylar axis and confirmed by creating a rectangular flexion gap at 90 degrees. The size 4 cutting block is pinned in this rotation and the anterior, posterior and chamfer cuts are made with the oscillating saw. The intercondylar block is then placed and that cut is made.  Trial size 4 tibial component, trial size 4 narrow posterior stabilized femur and a 10  mm posterior stabilized rotating platform insert trial is placed. Full extension is achieved with excellent varus/valgus and anterior/posterior balance throughout full range of motion. The patella is everted and thickness measured to be 24  mm. Free hand resection is taken to 14 mm, a 38 template is  placed, lug holes are drilled, trial patella is placed, and it tracks normally. Osteophytes are removed off the posterior femur with the trial in place. All trials are removed and the cut bone surfaces prepared with pulsatile lavage. Cement is mixed and once ready for implantation, the size 4 tibial implant, size  4 narrow posterior stabilized femoral component, and the size 38 patella are cemented in place and the patella is held with the clamp. The trial insert is placed and the knee held in full extension. The Exparel (20 ml mixed with 60 ml saline) is injected into the extensor mechanism, posterior capsule, medial and lateral gutters and subcutaneous tissues.  All extruded cement is removed and once the cement is hard the permanent 10 mm posterior stabilized rotating platform insert is placed into the tibial tray.      The wound is copiously irrigated with saline solution and the extensor mechanism closed with #1 V-loc suture. The tourniquet is released for a total tourniquet time of 36  minutes. Flexion against gravity is 140 degrees and the patella tracks normally. Subcutaneous tissue is closed with 2.0 vicryl and subcuticular with running 4.0 Monocryl. The incision is cleaned and dried and steri-strips and a bulky sterile dressing are applied. The limb is placed into a knee immobilizer and the patient is awakened and transported to recovery in stable condition.      Please note that a surgical assistant was a medical necessity for this procedure in order to perform it in a safe and expeditious manner. Surgical assistant was necessary to retract the ligaments and vital neurovascular structures to prevent injury to them and also necessary for proper positioning of the limb to allow for anatomic placement of the prosthesis.   Robyn Lambert Reem Fleury, MD    02/20/2020, 8:15 AM

## 2020-02-20 NOTE — Progress Notes (Signed)
Orthopedic Tech Progress Note Patient Details:  Robyn Lambert Feb 09, 1941 [id number]  Patient ID: Robyn Lambert, female   DOB: [birth year], 79 y.o.   MRN: [id number]   Robyn Lambert 02/20/2020, 2:41 PM cpm removed @1445 

## 2020-02-20 NOTE — Care Plan (Signed)
Ortho Bundle Case Management Note  Patient Details  Name: Robyn Lambert MRN: [id number] Date of Birth: May 30, 1941                  R TKA on 02-20-20 DCP: Home with dtr. 1 story home with ramp entrance. DME: No needs. Has a RW and 3-in-1. PT: [facility] 02-24-20   DME Arranged:  N/A (na) DME Agency:     HH Arranged:    HH Agency:     Additional Comments: Please contact me with any questions of if this plan should need to change.  Marianne Sofia, RN,CCM [facility]  [phone] 02/20/2020, 11:01 AM

## 2020-02-20 NOTE — Plan of Care (Signed)

## 2020-02-21 ENCOUNTER — Encounter (HOSPITAL_COMMUNITY): Payer: Self-pay | Admitting: Orthopedic Surgery

## 2020-02-21 DIAGNOSIS — R0602 Shortness of breath: Secondary | ICD-10-CM | POA: Diagnosis not present

## 2020-02-21 DIAGNOSIS — E669 Obesity, unspecified: Secondary | ICD-10-CM | POA: Diagnosis not present

## 2020-02-21 DIAGNOSIS — E039 Hypothyroidism, unspecified: Secondary | ICD-10-CM | POA: Diagnosis not present

## 2020-02-21 DIAGNOSIS — R002 Palpitations: Secondary | ICD-10-CM | POA: Diagnosis not present

## 2020-02-21 DIAGNOSIS — Z96612 Presence of left artificial shoulder joint: Secondary | ICD-10-CM | POA: Diagnosis not present

## 2020-02-21 DIAGNOSIS — K219 Gastro-esophageal reflux disease without esophagitis: Secondary | ICD-10-CM | POA: Diagnosis not present

## 2020-02-21 DIAGNOSIS — F419 Anxiety disorder, unspecified: Secondary | ICD-10-CM | POA: Diagnosis not present

## 2020-02-21 DIAGNOSIS — M1711 Unilateral primary osteoarthritis, right knee: Secondary | ICD-10-CM | POA: Diagnosis not present

## 2020-02-21 DIAGNOSIS — Z6833 Body mass index (BMI) 33.0-33.9, adult: Secondary | ICD-10-CM | POA: Diagnosis not present

## 2020-02-21 LAB — CBC
HCT: 32.9 % — ABNORMAL LOW (ref 36.0–46.0)
Hemoglobin: 10.8 g/dL — ABNORMAL LOW (ref 12.0–15.0)
MCH: 29.5 pg (ref 26.0–34.0)
MCHC: 32.8 g/dL (ref 30.0–36.0)
MCV: 89.9 fL (ref 80.0–100.0)
Platelets: 209 10*3/uL (ref 150–400)
RBC: 3.66 MIL/uL — ABNORMAL LOW (ref 3.87–5.11)
RDW: 13.2 % (ref 11.5–15.5)
WBC: 10.4 10*3/uL (ref 4.0–10.5)
nRBC: 0 % (ref 0.0–0.2)

## 2020-02-21 LAB — BASIC METABOLIC PANEL
Anion gap: 8 (ref 5–15)
BUN: 12 mg/dL (ref 8–23)
CO2: 27 mmol/L (ref 22–32)
Calcium: 9 mg/dL (ref 8.9–10.3)
Chloride: 105 mmol/L (ref 98–111)
Creatinine, Ser: 0.55 mg/dL (ref 0.44–1.00)
GFR calc Af Amer: >60 mL/min (ref >60)
GFR calc non Af Amer: >60 mL/min (ref >60)
Glucose, Bld: 118 mg/dL — ABNORMAL HIGH (ref 70–99)
Potassium: 4.2 mmol/L (ref 3.5–5.1)
Sodium: 140 mmol/L (ref 135–145)

## 2020-02-21 MED ORDER — OXYCODONE HCL 5 MG PO TABS: 5.0000 mg | ORAL_TABLET | Freq: Four times a day (QID) | ORAL | 0 refills | Status: AC | PRN

## 2020-02-21 MED ORDER — METHOCARBAMOL 500 MG PO TABS: 500.0000 mg | ORAL_TABLET | Freq: Four times a day (QID) | ORAL | 0 refills | Status: AC | PRN

## 2020-02-21 MED ORDER — GABAPENTIN 100 MG PO CAPS: ORAL_CAPSULE | ORAL | 0 refills | Status: AC

## 2020-02-21 MED ORDER — ASPIRIN 325 MG PO TBEC: 325.0000 mg | DELAYED_RELEASE_TABLET | Freq: Two times a day (BID) | ORAL | 0 refills | Status: AC

## 2020-02-21 MED ORDER — TRAMADOL HCL 50 MG PO TABS: 50.0000 mg | ORAL_TABLET | Freq: Four times a day (QID) | ORAL | 0 refills | Status: AC | PRN

## 2020-02-21 NOTE — Progress Notes (Signed)
Physical Therapy Treatment Patient Details Name: Robyn Lambert MRN: [id number] DOB: [birth year] Today's Date: 02/21/2020    History of Present Illness s/p R TKA. PMHx: L reverse TSA,  L TKA    PT Comments    Pt ambulated in hallway and practiced safe stair technique.  Daughter present and observed session.  Pt had no further questions and feels ready for d/c home today.  Provided HEP and stair handouts.     Follow Up Recommendations  Follow surgeons recommendation for DC plan and follow-up therapies     Equipment Recommendations  None recommended by PT    Recommendations for Other Services       Precautions / Restrictions Precautions Precautions: Knee;Fall Precaution Comments: able to perform SLR Restrictions Other Position/Activity Restrictions: WBAT    Mobility  Bed Mobility               General bed mobility comments: pt up in recliner on arrival  Transfers Overall transfer level: Needs assistance Equipment used: Rolling walker (2 wheeled) Transfers: Sit to/from Stand Sit to Stand: Min guard         General transfer comment: verbal cues for UE and LE positioning  Ambulation/Gait Ambulation/Gait assistance: Min guard Gait Distance (Feet): 60 Feet Assistive device: Rolling walker (2 wheeled) Gait Pattern/deviations: Step-to pattern;Decreased stance time - right     General Gait Details: verbal cues for sequence, RW positioning, step length, posture   Stairs Stairs: Yes Stairs assistance: Min guard Stair Management: Step to pattern;Backwards;With walker Number of Stairs: 3 General stair comments: verbal cues for sequence and RW positioning; pt performed 3 short steps and then again with 2 tall steps   Wheelchair Mobility    Modified Rankin (Stroke Patients Only)       Balance                                            Cognition Arousal/Alertness: Awake/alert Behavior During Therapy: WFL for tasks  assessed/performed Overall Cognitive Status: Within Functional Limits for tasks assessed                                        Exercises     General Comments        Pertinent Vitals/Pain Pain Assessment: 0-10 Pain Score: 4  Pain Location: right knee Pain Descriptors / Indicators: Sore;Aching Pain Intervention(s): Repositioned;Monitored during session    Home Living                      Prior Function            PT Goals (current goals can now be found in the care plan section) Progress towards PT goals: Progressing toward goals    Frequency    7X/week      PT Plan Current plan remains appropriate    Co-evaluation              AM-PAC PT "6 Clicks" Mobility   Outcome Measure  Help needed turning from your back to your side while in a flat bed without using bedrails?: A Little Help needed moving from lying on your back to sitting on the side of a flat bed without using bedrails?: A Little Help needed moving to and from a bed to a chair (  including a wheelchair)?: A Little Help needed standing up from a chair using your arms (e.g., wheelchair or bedside chair)?: A Little Help needed to walk in hospital room?: A Little Help needed climbing 3-5 steps with a railing? : A Little 6 Click Score: 18    End of Session Equipment Utilized During Treatment: Gait belt Activity Tolerance: Patient tolerated treatment well Patient left: in chair;with call bell/phone within reach;with chair alarm set;with family/visitor present Nurse Communication: Mobility status PT Visit Diagnosis: Difficulty in walking, not elsewhere classified (R26.2)     Time: 2712-9290 PT Time Calculation (min) (ACUTE ONLY): 13 min  Charges:  $Gait Training: 8-22 mins                     Arlyce Dice, DPT Acute Rehabilitation Services Pager: [phone] Office: [place] [facility] 02/21/2020, 4:05 PM

## 2020-02-21 NOTE — Plan of Care (Signed)
Plan of care reviewed and discussed with the patient. 

## 2020-02-21 NOTE — Progress Notes (Signed)
Physical Therapy Treatment Patient Details Name: Robyn Lambert MRN: [id number] DOB: Feb 21, 1941 Today's Date: 02/21/2020    History of Present Illness s/p R TKA. PMHx: L reverse TSA,  L TKA    PT Comments    Pt ambulated in hallway and performed LE exercises.  Pt provided with HEP handout.  Will return to practice steps prior to d/c.    Follow Up Recommendations  Follow surgeon's recommendation for DC plan and follow-up therapies     Equipment Recommendations  None recommended by PT    Recommendations for Other Services       Precautions / Restrictions Precautions Precautions: Knee;Fall Precaution Comments: able to perform SLR Restrictions Other Position/Activity Restrictions: WBAT    Mobility  Bed Mobility               General bed mobility comments: pt up in recliner on arrival  Transfers Overall transfer level: Needs assistance Equipment used: Rolling walker (2 wheeled) Transfers: Sit to/from Stand Sit to Stand: Min guard         General transfer comment: verbal cues for UE and LE positioning  Ambulation/Gait Ambulation/Gait assistance: Min guard Gait Distance (Feet): 50 Feet Assistive device: Rolling walker (2 wheeled) Gait Pattern/deviations: Step-to pattern;Decreased stance time - right     General Gait Details: verbal cues for sequence, RW positioning, step length; distance to tolerance   Stairs             Wheelchair Mobility    Modified Rankin (Stroke Patients Only)       Balance                                            Cognition Arousal/Alertness: Awake/alert Behavior During Therapy: WFL for tasks assessed/performed Overall Cognitive Status: Within Functional Limits for tasks assessed                                        Exercises Total Joint Exercises Ankle Circles/Pumps: AROM;Both;10 reps Quad Sets: 10 reps;Both;AROM Short Arc Quad: AROM;Right;10 reps Heel Slides: Right;10  reps;AAROM Hip ABduction/ADduction: AROM;Right;10 reps Straight Leg Raises: AROM;Right;10 reps;AAROM Knee Flexion: AAROM;Right;10 reps;Seated    General Comments        Pertinent Vitals/Pain Pain Assessment: 0-10 Pain Score: 4  Pain Location: right knee Pain Descriptors / Indicators: Sore;Aching Pain Intervention(s): Repositioned;Monitored during session;Limited activity within patient's tolerance;Ice applied    Home Living                      Prior Function            PT Goals (current goals can now be found in the care plan section) Progress towards PT goals: Progressing toward goals    Frequency    7X/week      PT Plan Current plan remains appropriate    Co-evaluation              AM-PAC PT "6 Clicks" Mobility   Outcome Measure  Help needed turning from your back to your side while in a flat bed without using bedrails?: A Little Help needed moving from lying on your back to sitting on the side of a flat bed without using bedrails?: A Little Help needed moving to and from a bed to a chair (including  a wheelchair)?: A Little Help needed standing up from a chair using your arms (e.g., wheelchair or bedside chair)?: A Little Help needed to walk in hospital room?: A Little Help needed climbing 3-5 steps with a railing? : A Little 6 Click Score: 18    End of Session Equipment Utilized During Treatment: Gait belt Activity Tolerance: Patient tolerated treatment well Patient left: in chair;with call bell/phone within reach;with chair alarm set   PT Visit Diagnosis: Difficulty in walking, not elsewhere classified (R26.2)     Time: 0712-5247 PT Time Calculation (min) (ACUTE ONLY): 24 min  Charges:  $Gait Training: 8-22 mins $Therapeutic Exercise: 8-22 mins                    Jannette Spanner PT, DPT Acute Rehabilitation Services Pager: [phone] Office: [place] [facility] 02/21/2020, 12:43 PM

## 2020-02-21 NOTE — Progress Notes (Signed)
Subjective: 1 Day Post-Op Procedure(s) (LRB): TOTAL KNEE ARTHROPLASTY (Right) Patient reports pain as mild.   Patient seen in rounds by Dr. Wynelle Link. Patient is well, and has had no acute complaints or problems other than pain in the right knee. No issues overnight. Denies chest pain, SOB, or calf pain. Foley catheter to be removed this AM. We will continue therapy today, ambulated 40' yesterday.   Objective: Vital signs in last 24 hours: Temp:  [97.3 F (36.3 C)-98.2 F (36.8 C)] 97.7 F (36.5 C) (07/13 0623) Pulse Rate:  [71-94] 86 (07/13 0623) Resp:  [11-22] 16 (07/13 0623) BP: (103-150)/(59-85) 142/79 (07/13 0623) SpO2:  [99 %-100 %] 100 % (07/13 0623)  Intake/Output from previous day:  Intake/Output Summary (Last 24 hours) at 02/21/2020 0734 Last data filed at 02/21/2020 6283 Gross per 24 hour  Intake 3858.38 ml  Output 4400 ml  Net -541.62 ml     Intake/Output this shift: No intake/output data recorded.  Labs: Recent Labs    02/21/20 0304  HGB 10.8*   Recent Labs    02/21/20 0304  WBC 10.4  RBC 3.66*  HCT 32.9*  PLT 209   Recent Labs    02/21/20 0304  NA 140  K 4.2  CL 105  CO2 27  BUN 12  CREATININE 0.55  GLUCOSE 118*  CALCIUM 9.0   No results for input(s): LABPT, INR in the last 72 hours.  Exam: General - Patient is Alert and Oriented Extremity - Neurologically intact Neurovascular intact Sensation intact distally Dorsiflexion/Plantar flexion intact Dressing - dressing C/D/I Motor Function - intact, moving foot and toes well on exam.   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   Dyspnea    REPORTS THIS IS ON EXERTION ONLY , "SOMETIMES BY THE TIME I WALK UPHILL ON MY DRIVEAWAY, MY HEART IS POUNDING AND I AM OUT OF BREATH "    Dysrhythmia    hx PACs    GERD (gastroesophageal reflux disease)    Hyperlipidemia    Hypothyroidism    Joint pain    MULTIPLE ARTHRITIC JOINTS     Palpitations    DAILY , DESCIRBES AS "RACING" DENIES ASSOCIATED SOB, DIZZINESS, NOR CHEST PAIN    Rectocele    Thyroid disease     Assessment/Plan: 1 Day Post-Op Procedure(s) (LRB): TOTAL KNEE ARTHROPLASTY (Right) Active Problems:   Osteoarthritis of right knee  Estimated body mass index is 33.36 kg/m as calculated from the following:   Height as of this encounter: 5\' 4"  (1.626 m).   Weight as of this encounter: 88.2 kg. Advance diet Up with therapy D/C IV fluids   Patient's anticipated LOS is less than 2 midnights, meeting these requirements: - Younger than 11 - Lives within 1 hour of care - Has a competent adult at home to recover with post-op recover - NO history of  - Chronic pain requiring opiods  - Diabetes  - Coronary Artery Disease  - Heart failure  - Heart attack  - Stroke  - DVT/VTE  - Cardiac arrhythmia  - Respiratory Failure/COPD  - Renal failure  - Anemia  - Advanced Liver disease  DVT Prophylaxis - Aspirin Weight bearing as tolerated. Continue therapy.  Plan is to go Home after hospital stay. Plan for discharge later today if progresses with therapy and is meeting her goals. Scheduled for outpatient PT at [facility]. Follow-up in the office  July 27th.  The PDMP database was reviewed today (02/21/2020) prior to any opioid medications being prescribed to this patient.   Theresa Duty, PA-C Orthopedic Surgery [phone] 02/21/2020, 7:34 AM

## 2020-02-24 DIAGNOSIS — M62551 Muscle wasting and atrophy, not elsewhere classified, right thigh: Secondary | ICD-10-CM | POA: Diagnosis not present

## 2020-02-24 DIAGNOSIS — Z96651 Presence of right artificial knee joint: Secondary | ICD-10-CM | POA: Diagnosis not present

## 2020-02-24 DIAGNOSIS — M25561 Pain in right knee: Secondary | ICD-10-CM | POA: Diagnosis not present

## 2020-02-24 DIAGNOSIS — M25461 Effusion, right knee: Secondary | ICD-10-CM | POA: Diagnosis not present

## 2020-02-27 DIAGNOSIS — M62551 Muscle wasting and atrophy, not elsewhere classified, right thigh: Secondary | ICD-10-CM | POA: Diagnosis not present

## 2020-02-27 DIAGNOSIS — M25461 Effusion, right knee: Secondary | ICD-10-CM | POA: Diagnosis not present

## 2020-02-27 DIAGNOSIS — M25561 Pain in right knee: Secondary | ICD-10-CM | POA: Diagnosis not present

## 2020-02-27 DIAGNOSIS — Z96651 Presence of right artificial knee joint: Secondary | ICD-10-CM | POA: Diagnosis not present

## 2020-02-29 DIAGNOSIS — M25561 Pain in right knee: Secondary | ICD-10-CM | POA: Diagnosis not present

## 2020-02-29 DIAGNOSIS — M62551 Muscle wasting and atrophy, not elsewhere classified, right thigh: Secondary | ICD-10-CM | POA: Diagnosis not present

## 2020-02-29 DIAGNOSIS — M25461 Effusion, right knee: Secondary | ICD-10-CM | POA: Diagnosis not present

## 2020-02-29 DIAGNOSIS — Z96651 Presence of right artificial knee joint: Secondary | ICD-10-CM | POA: Diagnosis not present

## 2020-03-02 DIAGNOSIS — M25561 Pain in right knee: Secondary | ICD-10-CM | POA: Diagnosis not present

## 2020-03-02 DIAGNOSIS — Z96651 Presence of right artificial knee joint: Secondary | ICD-10-CM | POA: Diagnosis not present

## 2020-03-02 DIAGNOSIS — M25461 Effusion, right knee: Secondary | ICD-10-CM | POA: Diagnosis not present

## 2020-03-02 DIAGNOSIS — M62551 Muscle wasting and atrophy, not elsewhere classified, right thigh: Secondary | ICD-10-CM | POA: Diagnosis not present

## 2020-03-05 DIAGNOSIS — Z96651 Presence of right artificial knee joint: Secondary | ICD-10-CM | POA: Diagnosis not present

## 2020-03-05 DIAGNOSIS — M62551 Muscle wasting and atrophy, not elsewhere classified, right thigh: Secondary | ICD-10-CM | POA: Diagnosis not present

## 2020-03-05 DIAGNOSIS — M25561 Pain in right knee: Secondary | ICD-10-CM | POA: Diagnosis not present

## 2020-03-05 DIAGNOSIS — M25461 Effusion, right knee: Secondary | ICD-10-CM | POA: Diagnosis not present

## 2020-03-07 DIAGNOSIS — M62551 Muscle wasting and atrophy, not elsewhere classified, right thigh: Secondary | ICD-10-CM | POA: Diagnosis not present

## 2020-03-07 DIAGNOSIS — M25461 Effusion, right knee: Secondary | ICD-10-CM | POA: Diagnosis not present

## 2020-03-07 DIAGNOSIS — M25561 Pain in right knee: Secondary | ICD-10-CM | POA: Diagnosis not present

## 2020-03-07 DIAGNOSIS — Z96651 Presence of right artificial knee joint: Secondary | ICD-10-CM | POA: Diagnosis not present

## 2020-03-09 DIAGNOSIS — M62551 Muscle wasting and atrophy, not elsewhere classified, right thigh: Secondary | ICD-10-CM | POA: Diagnosis not present

## 2020-03-09 DIAGNOSIS — M25561 Pain in right knee: Secondary | ICD-10-CM | POA: Diagnosis not present

## 2020-03-09 DIAGNOSIS — M25461 Effusion, right knee: Secondary | ICD-10-CM | POA: Diagnosis not present

## 2020-03-09 DIAGNOSIS — Z96651 Presence of right artificial knee joint: Secondary | ICD-10-CM | POA: Diagnosis not present

## 2020-03-12 DIAGNOSIS — M25461 Effusion, right knee: Secondary | ICD-10-CM | POA: Diagnosis not present

## 2020-03-12 DIAGNOSIS — M25561 Pain in right knee: Secondary | ICD-10-CM | POA: Diagnosis not present

## 2020-03-12 DIAGNOSIS — Z96651 Presence of right artificial knee joint: Secondary | ICD-10-CM | POA: Diagnosis not present

## 2020-03-12 DIAGNOSIS — M62551 Muscle wasting and atrophy, not elsewhere classified, right thigh: Secondary | ICD-10-CM | POA: Diagnosis not present

## 2020-03-14 DIAGNOSIS — M25561 Pain in right knee: Secondary | ICD-10-CM | POA: Diagnosis not present

## 2020-03-14 DIAGNOSIS — M25461 Effusion, right knee: Secondary | ICD-10-CM | POA: Diagnosis not present

## 2020-03-14 DIAGNOSIS — M62551 Muscle wasting and atrophy, not elsewhere classified, right thigh: Secondary | ICD-10-CM | POA: Diagnosis not present

## 2020-03-14 DIAGNOSIS — Z96651 Presence of right artificial knee joint: Secondary | ICD-10-CM | POA: Diagnosis not present

## 2020-03-16 DIAGNOSIS — M25461 Effusion, right knee: Secondary | ICD-10-CM | POA: Diagnosis not present

## 2020-03-16 DIAGNOSIS — M62551 Muscle wasting and atrophy, not elsewhere classified, right thigh: Secondary | ICD-10-CM | POA: Diagnosis not present

## 2020-03-16 DIAGNOSIS — M25561 Pain in right knee: Secondary | ICD-10-CM | POA: Diagnosis not present

## 2020-03-16 DIAGNOSIS — Z96651 Presence of right artificial knee joint: Secondary | ICD-10-CM | POA: Diagnosis not present

## 2020-03-19 DIAGNOSIS — Z96651 Presence of right artificial knee joint: Secondary | ICD-10-CM | POA: Diagnosis not present

## 2020-03-19 DIAGNOSIS — M25461 Effusion, right knee: Secondary | ICD-10-CM | POA: Diagnosis not present

## 2020-03-19 DIAGNOSIS — M62551 Muscle wasting and atrophy, not elsewhere classified, right thigh: Secondary | ICD-10-CM | POA: Diagnosis not present

## 2020-03-19 DIAGNOSIS — M25561 Pain in right knee: Secondary | ICD-10-CM | POA: Diagnosis not present

## 2020-03-21 DIAGNOSIS — Z96651 Presence of right artificial knee joint: Secondary | ICD-10-CM | POA: Diagnosis not present

## 2020-03-21 DIAGNOSIS — M62551 Muscle wasting and atrophy, not elsewhere classified, right thigh: Secondary | ICD-10-CM | POA: Diagnosis not present

## 2020-03-21 DIAGNOSIS — M25461 Effusion, right knee: Secondary | ICD-10-CM | POA: Diagnosis not present

## 2020-03-21 DIAGNOSIS — M25561 Pain in right knee: Secondary | ICD-10-CM | POA: Diagnosis not present

## 2020-03-27 DIAGNOSIS — Z96651 Presence of right artificial knee joint: Secondary | ICD-10-CM | POA: Diagnosis not present

## 2020-03-28 DIAGNOSIS — Z96651 Presence of right artificial knee joint: Secondary | ICD-10-CM | POA: Diagnosis not present

## 2020-03-28 DIAGNOSIS — M25461 Effusion, right knee: Secondary | ICD-10-CM | POA: Diagnosis not present

## 2020-03-28 DIAGNOSIS — M62551 Muscle wasting and atrophy, not elsewhere classified, right thigh: Secondary | ICD-10-CM | POA: Diagnosis not present

## 2020-03-28 DIAGNOSIS — M25561 Pain in right knee: Secondary | ICD-10-CM | POA: Diagnosis not present

## 2020-03-30 DIAGNOSIS — Z96651 Presence of right artificial knee joint: Secondary | ICD-10-CM | POA: Diagnosis not present

## 2020-03-30 DIAGNOSIS — M25561 Pain in right knee: Secondary | ICD-10-CM | POA: Diagnosis not present

## 2020-03-30 DIAGNOSIS — M62551 Muscle wasting and atrophy, not elsewhere classified, right thigh: Secondary | ICD-10-CM | POA: Diagnosis not present

## 2020-03-30 DIAGNOSIS — M25461 Effusion, right knee: Secondary | ICD-10-CM | POA: Diagnosis not present

## 2020-04-02 DIAGNOSIS — M25561 Pain in right knee: Secondary | ICD-10-CM | POA: Diagnosis not present

## 2020-04-02 DIAGNOSIS — M62551 Muscle wasting and atrophy, not elsewhere classified, right thigh: Secondary | ICD-10-CM | POA: Diagnosis not present

## 2020-04-02 DIAGNOSIS — M25461 Effusion, right knee: Secondary | ICD-10-CM | POA: Diagnosis not present

## 2020-04-02 DIAGNOSIS — Z96651 Presence of right artificial knee joint: Secondary | ICD-10-CM | POA: Diagnosis not present

## 2020-04-04 DIAGNOSIS — Z96651 Presence of right artificial knee joint: Secondary | ICD-10-CM | POA: Diagnosis not present

## 2020-04-04 DIAGNOSIS — M25461 Effusion, right knee: Secondary | ICD-10-CM | POA: Diagnosis not present

## 2020-04-04 DIAGNOSIS — M62551 Muscle wasting and atrophy, not elsewhere classified, right thigh: Secondary | ICD-10-CM | POA: Diagnosis not present

## 2020-04-04 DIAGNOSIS — M25561 Pain in right knee: Secondary | ICD-10-CM | POA: Diagnosis not present

## 2020-04-06 DIAGNOSIS — M25561 Pain in right knee: Secondary | ICD-10-CM | POA: Diagnosis not present

## 2020-04-06 DIAGNOSIS — M25461 Effusion, right knee: Secondary | ICD-10-CM | POA: Diagnosis not present

## 2020-04-06 DIAGNOSIS — M62551 Muscle wasting and atrophy, not elsewhere classified, right thigh: Secondary | ICD-10-CM | POA: Diagnosis not present

## 2020-04-06 DIAGNOSIS — Z96651 Presence of right artificial knee joint: Secondary | ICD-10-CM | POA: Diagnosis not present

## 2020-04-11 DIAGNOSIS — M25461 Effusion, right knee: Secondary | ICD-10-CM | POA: Diagnosis not present

## 2020-04-11 DIAGNOSIS — Z96651 Presence of right artificial knee joint: Secondary | ICD-10-CM | POA: Diagnosis not present

## 2020-04-11 DIAGNOSIS — M62551 Muscle wasting and atrophy, not elsewhere classified, right thigh: Secondary | ICD-10-CM | POA: Diagnosis not present

## 2020-04-11 DIAGNOSIS — M25561 Pain in right knee: Secondary | ICD-10-CM | POA: Diagnosis not present

## 2020-04-13 DIAGNOSIS — M25561 Pain in right knee: Secondary | ICD-10-CM | POA: Diagnosis not present

## 2020-04-13 DIAGNOSIS — M25461 Effusion, right knee: Secondary | ICD-10-CM | POA: Diagnosis not present

## 2020-04-13 DIAGNOSIS — M62551 Muscle wasting and atrophy, not elsewhere classified, right thigh: Secondary | ICD-10-CM | POA: Diagnosis not present

## 2020-04-13 DIAGNOSIS — Z96651 Presence of right artificial knee joint: Secondary | ICD-10-CM | POA: Diagnosis not present

## 2020-04-17 DIAGNOSIS — M25461 Effusion, right knee: Secondary | ICD-10-CM | POA: Diagnosis not present

## 2020-04-17 DIAGNOSIS — Z96651 Presence of right artificial knee joint: Secondary | ICD-10-CM | POA: Diagnosis not present

## 2020-04-17 DIAGNOSIS — M62551 Muscle wasting and atrophy, not elsewhere classified, right thigh: Secondary | ICD-10-CM | POA: Diagnosis not present

## 2020-04-17 DIAGNOSIS — M25561 Pain in right knee: Secondary | ICD-10-CM | POA: Diagnosis not present

## 2020-04-19 DIAGNOSIS — M25561 Pain in right knee: Secondary | ICD-10-CM | POA: Diagnosis not present

## 2020-04-19 DIAGNOSIS — M62551 Muscle wasting and atrophy, not elsewhere classified, right thigh: Secondary | ICD-10-CM | POA: Diagnosis not present

## 2020-04-19 DIAGNOSIS — M25461 Effusion, right knee: Secondary | ICD-10-CM | POA: Diagnosis not present

## 2020-04-19 DIAGNOSIS — Z96651 Presence of right artificial knee joint: Secondary | ICD-10-CM | POA: Diagnosis not present

## 2020-04-25 DIAGNOSIS — Z0183 Encounter for blood typing: Secondary | ICD-10-CM | POA: Diagnosis not present

## 2020-04-25 DIAGNOSIS — Z20822 Contact with and (suspected) exposure to covid-19: Secondary | ICD-10-CM | POA: Diagnosis not present

## 2020-04-25 DIAGNOSIS — J069 Acute upper respiratory infection, unspecified: Secondary | ICD-10-CM | POA: Diagnosis not present

## 2020-05-01 DIAGNOSIS — M62551 Muscle wasting and atrophy, not elsewhere classified, right thigh: Secondary | ICD-10-CM | POA: Diagnosis not present

## 2020-05-01 DIAGNOSIS — M25561 Pain in right knee: Secondary | ICD-10-CM | POA: Diagnosis not present

## 2020-05-01 DIAGNOSIS — M25461 Effusion, right knee: Secondary | ICD-10-CM | POA: Diagnosis not present

## 2020-05-01 DIAGNOSIS — Z96651 Presence of right artificial knee joint: Secondary | ICD-10-CM | POA: Diagnosis not present

## 2020-05-03 DIAGNOSIS — M62551 Muscle wasting and atrophy, not elsewhere classified, right thigh: Secondary | ICD-10-CM | POA: Diagnosis not present

## 2020-05-03 DIAGNOSIS — M25461 Effusion, right knee: Secondary | ICD-10-CM | POA: Diagnosis not present

## 2020-05-03 DIAGNOSIS — M25561 Pain in right knee: Secondary | ICD-10-CM | POA: Diagnosis not present

## 2020-05-03 DIAGNOSIS — Z96651 Presence of right artificial knee joint: Secondary | ICD-10-CM | POA: Diagnosis not present

## 2020-05-08 DIAGNOSIS — J029 Acute pharyngitis, unspecified: Secondary | ICD-10-CM | POA: Diagnosis not present

## 2020-05-08 DIAGNOSIS — Z0183 Encounter for blood typing: Secondary | ICD-10-CM | POA: Diagnosis not present

## 2020-05-31 DIAGNOSIS — J029 Acute pharyngitis, unspecified: Secondary | ICD-10-CM | POA: Diagnosis not present

## 2020-06-14 DIAGNOSIS — H61303 Acquired stenosis of external ear canal, unspecified, bilateral: Secondary | ICD-10-CM | POA: Diagnosis not present

## 2020-06-14 DIAGNOSIS — J358 Other chronic diseases of tonsils and adenoids: Secondary | ICD-10-CM | POA: Diagnosis not present

## 2020-06-14 DIAGNOSIS — B9789 Other viral agents as the cause of diseases classified elsewhere: Secondary | ICD-10-CM | POA: Diagnosis not present

## 2020-06-14 DIAGNOSIS — H9202 Otalgia, left ear: Secondary | ICD-10-CM | POA: Diagnosis not present

## 2020-06-14 DIAGNOSIS — J028 Acute pharyngitis due to other specified organisms: Secondary | ICD-10-CM | POA: Diagnosis not present

## 2020-06-14 DIAGNOSIS — J029 Acute pharyngitis, unspecified: Secondary | ICD-10-CM | POA: Diagnosis not present

## 2020-06-24 DIAGNOSIS — Z20828 Contact with and (suspected) exposure to other viral communicable diseases: Secondary | ICD-10-CM | POA: Diagnosis not present

## 2020-06-24 DIAGNOSIS — J312 Chronic pharyngitis: Secondary | ICD-10-CM | POA: Diagnosis not present

## 2020-07-03 DIAGNOSIS — Z8249 Family history of ischemic heart disease and other diseases of the circulatory system: Secondary | ICD-10-CM | POA: Diagnosis not present

## 2020-07-03 DIAGNOSIS — E669 Obesity, unspecified: Secondary | ICD-10-CM | POA: Diagnosis not present

## 2020-07-03 DIAGNOSIS — E039 Hypothyroidism, unspecified: Secondary | ICD-10-CM | POA: Diagnosis not present

## 2020-07-03 DIAGNOSIS — R030 Elevated blood-pressure reading, without diagnosis of hypertension: Secondary | ICD-10-CM | POA: Diagnosis not present

## 2020-07-03 DIAGNOSIS — Z803 Family history of malignant neoplasm of breast: Secondary | ICD-10-CM | POA: Diagnosis not present

## 2020-07-24 DIAGNOSIS — R12 Heartburn: Secondary | ICD-10-CM | POA: Diagnosis not present

## 2020-07-24 DIAGNOSIS — Z7982 Long term (current) use of aspirin: Secondary | ICD-10-CM | POA: Diagnosis not present

## 2020-07-24 DIAGNOSIS — Z79899 Other long term (current) drug therapy: Secondary | ICD-10-CM | POA: Diagnosis not present

## 2020-07-24 DIAGNOSIS — E039 Hypothyroidism, unspecified: Secondary | ICD-10-CM | POA: Diagnosis not present

## 2020-07-24 DIAGNOSIS — R0789 Other chest pain: Secondary | ICD-10-CM | POA: Diagnosis not present

## 2020-07-24 DIAGNOSIS — K219 Gastro-esophageal reflux disease without esophagitis: Secondary | ICD-10-CM | POA: Diagnosis not present

## 2020-07-24 DIAGNOSIS — F419 Anxiety disorder, unspecified: Secondary | ICD-10-CM | POA: Diagnosis not present

## 2020-07-24 DIAGNOSIS — M1990 Unspecified osteoarthritis, unspecified site: Secondary | ICD-10-CM | POA: Diagnosis not present

## 2020-07-24 DIAGNOSIS — F32A Depression, unspecified: Secondary | ICD-10-CM | POA: Diagnosis not present

## 2020-07-24 DIAGNOSIS — R072 Precordial pain: Secondary | ICD-10-CM | POA: Diagnosis not present

## 2020-07-25 DIAGNOSIS — F419 Anxiety disorder, unspecified: Secondary | ICD-10-CM | POA: Diagnosis not present

## 2020-07-25 DIAGNOSIS — Z79899 Other long term (current) drug therapy: Secondary | ICD-10-CM | POA: Diagnosis not present

## 2020-07-25 DIAGNOSIS — K219 Gastro-esophageal reflux disease without esophagitis: Secondary | ICD-10-CM | POA: Diagnosis not present

## 2020-07-25 DIAGNOSIS — F32A Depression, unspecified: Secondary | ICD-10-CM | POA: Diagnosis not present

## 2020-07-25 DIAGNOSIS — R079 Chest pain, unspecified: Secondary | ICD-10-CM | POA: Diagnosis not present

## 2020-07-25 DIAGNOSIS — E039 Hypothyroidism, unspecified: Secondary | ICD-10-CM | POA: Diagnosis not present

## 2020-07-25 DIAGNOSIS — M1990 Unspecified osteoarthritis, unspecified site: Secondary | ICD-10-CM | POA: Diagnosis not present

## 2020-07-25 DIAGNOSIS — Z7982 Long term (current) use of aspirin: Secondary | ICD-10-CM | POA: Diagnosis not present

## 2020-08-07 DIAGNOSIS — Z6833 Body mass index (BMI) 33.0-33.9, adult: Secondary | ICD-10-CM | POA: Diagnosis not present

## 2020-08-07 DIAGNOSIS — K219 Gastro-esophageal reflux disease without esophagitis: Secondary | ICD-10-CM | POA: Diagnosis not present

## 2020-08-07 DIAGNOSIS — Z8709 Personal history of other diseases of the respiratory system: Secondary | ICD-10-CM | POA: Diagnosis not present

## 2020-08-07 DIAGNOSIS — M79641 Pain in right hand: Secondary | ICD-10-CM | POA: Diagnosis not present

## 2020-08-24 DIAGNOSIS — U071 COVID-19: Secondary | ICD-10-CM | POA: Diagnosis not present

## 2020-09-11 DIAGNOSIS — Z1231 Encounter for screening mammogram for malignant neoplasm of breast: Secondary | ICD-10-CM | POA: Diagnosis not present

## 2020-09-18 DIAGNOSIS — Z6834 Body mass index (BMI) 34.0-34.9, adult: Secondary | ICD-10-CM | POA: Diagnosis not present

## 2020-09-18 DIAGNOSIS — N6012 Diffuse cystic mastopathy of left breast: Secondary | ICD-10-CM | POA: Diagnosis not present

## 2020-09-18 DIAGNOSIS — N6011 Diffuse cystic mastopathy of right breast: Secondary | ICD-10-CM | POA: Diagnosis not present

## 2020-09-18 DIAGNOSIS — D241 Benign neoplasm of right breast: Secondary | ICD-10-CM | POA: Diagnosis not present

## 2020-09-19 DIAGNOSIS — R002 Palpitations: Secondary | ICD-10-CM | POA: Diagnosis not present

## 2020-09-19 DIAGNOSIS — R42 Dizziness and giddiness: Secondary | ICD-10-CM | POA: Diagnosis not present

## 2020-09-19 DIAGNOSIS — I491 Atrial premature depolarization: Secondary | ICD-10-CM | POA: Diagnosis not present

## 2020-09-19 DIAGNOSIS — I10 Essential (primary) hypertension: Secondary | ICD-10-CM | POA: Diagnosis not present

## 2020-09-19 DIAGNOSIS — R0989 Other specified symptoms and signs involving the circulatory and respiratory systems: Secondary | ICD-10-CM | POA: Diagnosis not present

## 2020-10-19 DIAGNOSIS — Z20822 Contact with and (suspected) exposure to covid-19: Secondary | ICD-10-CM | POA: Diagnosis not present

## 2020-10-19 DIAGNOSIS — Z6832 Body mass index (BMI) 32.0-32.9, adult: Secondary | ICD-10-CM | POA: Diagnosis not present

## 2020-10-19 DIAGNOSIS — Z1339 Encounter for screening examination for other mental health and behavioral disorders: Secondary | ICD-10-CM | POA: Diagnosis not present

## 2020-10-19 DIAGNOSIS — E785 Hyperlipidemia, unspecified: Secondary | ICD-10-CM | POA: Diagnosis not present

## 2020-10-19 DIAGNOSIS — R413 Other amnesia: Secondary | ICD-10-CM | POA: Diagnosis not present

## 2020-10-19 DIAGNOSIS — H6981 Other specified disorders of Eustachian tube, right ear: Secondary | ICD-10-CM | POA: Diagnosis not present

## 2020-10-19 DIAGNOSIS — Z0001 Encounter for general adult medical examination with abnormal findings: Secondary | ICD-10-CM | POA: Diagnosis not present

## 2020-10-19 DIAGNOSIS — L309 Dermatitis, unspecified: Secondary | ICD-10-CM | POA: Diagnosis not present

## 2020-10-19 DIAGNOSIS — E039 Hypothyroidism, unspecified: Secondary | ICD-10-CM | POA: Diagnosis not present

## 2020-10-19 DIAGNOSIS — Z1331 Encounter for screening for depression: Secondary | ICD-10-CM | POA: Diagnosis not present

## 2020-10-26 DIAGNOSIS — J320 Chronic maxillary sinusitis: Secondary | ICD-10-CM | POA: Diagnosis not present

## 2020-10-26 DIAGNOSIS — J3489 Other specified disorders of nose and nasal sinuses: Secondary | ICD-10-CM | POA: Diagnosis not present

## 2020-10-26 DIAGNOSIS — L723 Sebaceous cyst: Secondary | ICD-10-CM | POA: Diagnosis not present

## 2020-10-26 DIAGNOSIS — R413 Other amnesia: Secondary | ICD-10-CM | POA: Diagnosis not present

## 2020-10-30 DIAGNOSIS — R10811 Right upper quadrant abdominal tenderness: Secondary | ICD-10-CM | POA: Diagnosis not present

## 2020-10-30 DIAGNOSIS — K30 Functional dyspepsia: Secondary | ICD-10-CM | POA: Diagnosis not present

## 2020-10-30 DIAGNOSIS — Z6833 Body mass index (BMI) 33.0-33.9, adult: Secondary | ICD-10-CM | POA: Diagnosis not present

## 2020-11-06 DIAGNOSIS — M549 Dorsalgia, unspecified: Secondary | ICD-10-CM | POA: Diagnosis not present

## 2020-11-06 DIAGNOSIS — R21 Rash and other nonspecific skin eruption: Secondary | ICD-10-CM | POA: Diagnosis not present

## 2020-11-06 DIAGNOSIS — R109 Unspecified abdominal pain: Secondary | ICD-10-CM | POA: Diagnosis not present

## 2020-11-06 DIAGNOSIS — R1084 Generalized abdominal pain: Secondary | ICD-10-CM | POA: Diagnosis not present

## 2020-11-28 DIAGNOSIS — G5601 Carpal tunnel syndrome, right upper limb: Secondary | ICD-10-CM | POA: Diagnosis not present

## 2020-11-28 DIAGNOSIS — M79642 Pain in left hand: Secondary | ICD-10-CM | POA: Diagnosis not present

## 2020-11-28 DIAGNOSIS — M79641 Pain in right hand: Secondary | ICD-10-CM | POA: Diagnosis not present

## 2020-12-03 DIAGNOSIS — Z6833 Body mass index (BMI) 33.0-33.9, adult: Secondary | ICD-10-CM | POA: Diagnosis not present

## 2020-12-03 DIAGNOSIS — E785 Hyperlipidemia, unspecified: Secondary | ICD-10-CM | POA: Diagnosis not present

## 2020-12-03 DIAGNOSIS — I679 Cerebrovascular disease, unspecified: Secondary | ICD-10-CM | POA: Diagnosis not present

## 2020-12-17 DIAGNOSIS — G5623 Lesion of ulnar nerve, bilateral upper limbs: Secondary | ICD-10-CM | POA: Diagnosis not present

## 2020-12-17 DIAGNOSIS — G5603 Carpal tunnel syndrome, bilateral upper limbs: Secondary | ICD-10-CM | POA: Diagnosis not present

## 2020-12-26 DIAGNOSIS — G5601 Carpal tunnel syndrome, right upper limb: Secondary | ICD-10-CM | POA: Diagnosis not present

## 2020-12-26 DIAGNOSIS — M79642 Pain in left hand: Secondary | ICD-10-CM | POA: Diagnosis not present

## 2021-01-30 DIAGNOSIS — G5601 Carpal tunnel syndrome, right upper limb: Secondary | ICD-10-CM | POA: Diagnosis not present

## 2021-02-14 DIAGNOSIS — E785 Hyperlipidemia, unspecified: Secondary | ICD-10-CM | POA: Diagnosis not present

## 2021-02-14 DIAGNOSIS — Z79899 Other long term (current) drug therapy: Secondary | ICD-10-CM | POA: Diagnosis not present

## 2021-02-20 DIAGNOSIS — Z96651 Presence of right artificial knee joint: Secondary | ICD-10-CM | POA: Diagnosis not present

## 2021-03-01 DIAGNOSIS — N3091 Cystitis, unspecified with hematuria: Secondary | ICD-10-CM | POA: Diagnosis not present

## 2021-03-01 DIAGNOSIS — R300 Dysuria: Secondary | ICD-10-CM | POA: Diagnosis not present

## 2021-03-26 DIAGNOSIS — M159 Polyosteoarthritis, unspecified: Secondary | ICD-10-CM | POA: Diagnosis not present

## 2021-03-26 DIAGNOSIS — K08409 Partial loss of teeth, unspecified cause, unspecified class: Secondary | ICD-10-CM | POA: Diagnosis not present

## 2021-03-26 DIAGNOSIS — Z6833 Body mass index (BMI) 33.0-33.9, adult: Secondary | ICD-10-CM | POA: Diagnosis not present

## 2021-03-26 DIAGNOSIS — I499 Cardiac arrhythmia, unspecified: Secondary | ICD-10-CM | POA: Diagnosis not present

## 2021-03-26 DIAGNOSIS — E039 Hypothyroidism, unspecified: Secondary | ICD-10-CM | POA: Diagnosis not present

## 2021-03-26 DIAGNOSIS — E669 Obesity, unspecified: Secondary | ICD-10-CM | POA: Diagnosis not present

## 2021-03-26 DIAGNOSIS — E785 Hyperlipidemia, unspecified: Secondary | ICD-10-CM | POA: Diagnosis not present

## 2021-03-26 DIAGNOSIS — Z8249 Family history of ischemic heart disease and other diseases of the circulatory system: Secondary | ICD-10-CM | POA: Diagnosis not present

## 2021-03-26 DIAGNOSIS — R32 Unspecified urinary incontinence: Secondary | ICD-10-CM | POA: Diagnosis not present

## 2021-04-18 IMAGING — DX LEFT SHOULDER - 1 VIEW
1 series · 1 of 1 positions shown · non-contrast
Comparison: None.

CLINICAL DATA: Status post left shoulder replacement

EXAM:
LEFT SHOULDER - 1 VIEW

[shoulder ap]
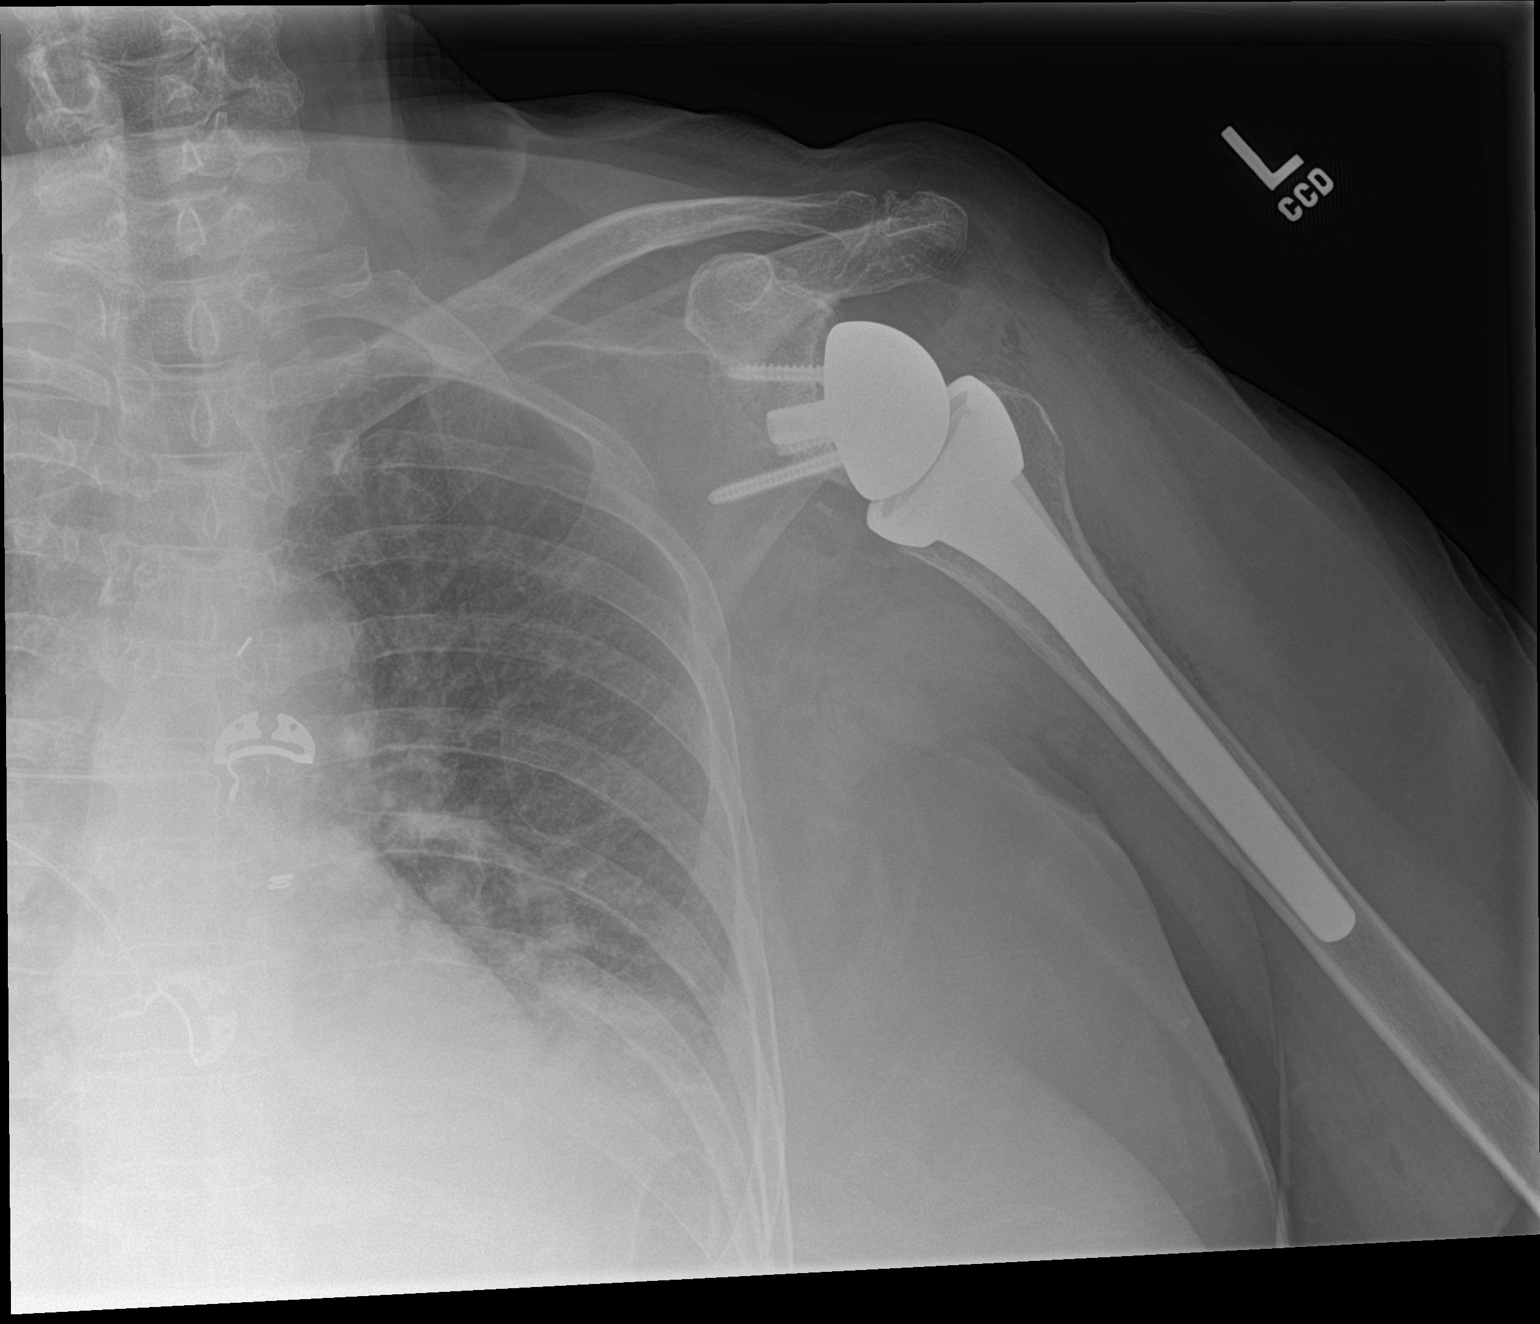

[1 of 1 positions shown; findings below may reference images not displayed]

FINDINGS: Left shoulder prosthesis is noted in satisfactory position. No acute
bony or soft tissue abnormality is noted. Degenerative changes of
the acromioclavicular joint are seen.
IMPRESSION: Status post left shoulder replacement.

## 2021-04-22 DIAGNOSIS — Z79899 Other long term (current) drug therapy: Secondary | ICD-10-CM | POA: Diagnosis not present

## 2021-04-22 DIAGNOSIS — E039 Hypothyroidism, unspecified: Secondary | ICD-10-CM | POA: Diagnosis not present

## 2021-04-22 DIAGNOSIS — K219 Gastro-esophageal reflux disease without esophagitis: Secondary | ICD-10-CM | POA: Diagnosis not present

## 2021-04-22 DIAGNOSIS — E785 Hyperlipidemia, unspecified: Secondary | ICD-10-CM | POA: Diagnosis not present

## 2021-05-16 DIAGNOSIS — R52 Pain, unspecified: Secondary | ICD-10-CM | POA: Diagnosis not present

## 2021-05-16 DIAGNOSIS — M19039 Primary osteoarthritis, unspecified wrist: Secondary | ICD-10-CM | POA: Diagnosis not present

## 2021-05-16 DIAGNOSIS — M19031 Primary osteoarthritis, right wrist: Secondary | ICD-10-CM | POA: Diagnosis not present

## 2021-06-04 DIAGNOSIS — H6121 Impacted cerumen, right ear: Secondary | ICD-10-CM | POA: Diagnosis not present

## 2021-06-04 DIAGNOSIS — Z6831 Body mass index (BMI) 31.0-31.9, adult: Secondary | ICD-10-CM | POA: Diagnosis not present

## 2021-06-04 DIAGNOSIS — Z1331 Encounter for screening for depression: Secondary | ICD-10-CM | POA: Diagnosis not present

## 2021-06-04 DIAGNOSIS — Z9181 History of falling: Secondary | ICD-10-CM | POA: Diagnosis not present

## 2021-06-04 DIAGNOSIS — E039 Hypothyroidism, unspecified: Secondary | ICD-10-CM | POA: Diagnosis not present

## 2021-06-06 DIAGNOSIS — Z961 Presence of intraocular lens: Secondary | ICD-10-CM | POA: Diagnosis not present

## 2021-07-31 DIAGNOSIS — J029 Acute pharyngitis, unspecified: Secondary | ICD-10-CM | POA: Diagnosis not present

## 2021-09-12 DIAGNOSIS — N816 Rectocele: Secondary | ICD-10-CM | POA: Diagnosis not present

## 2021-09-12 DIAGNOSIS — N952 Postmenopausal atrophic vaginitis: Secondary | ICD-10-CM | POA: Diagnosis not present

## 2021-09-12 DIAGNOSIS — N3946 Mixed incontinence: Secondary | ICD-10-CM | POA: Diagnosis not present

## 2021-09-12 DIAGNOSIS — K469 Unspecified abdominal hernia without obstruction or gangrene: Secondary | ICD-10-CM | POA: Diagnosis not present

## 2021-09-12 DIAGNOSIS — R152 Fecal urgency: Secondary | ICD-10-CM | POA: Diagnosis not present

## 2021-09-12 DIAGNOSIS — R159 Full incontinence of feces: Secondary | ICD-10-CM | POA: Diagnosis not present

## 2021-09-13 DIAGNOSIS — Z1231 Encounter for screening mammogram for malignant neoplasm of breast: Secondary | ICD-10-CM | POA: Diagnosis not present

## 2021-09-23 DIAGNOSIS — N6012 Diffuse cystic mastopathy of left breast: Secondary | ICD-10-CM | POA: Diagnosis not present

## 2021-09-23 DIAGNOSIS — N6011 Diffuse cystic mastopathy of right breast: Secondary | ICD-10-CM | POA: Diagnosis not present

## 2021-10-15 DIAGNOSIS — Z6829 Body mass index (BMI) 29.0-29.9, adult: Secondary | ICD-10-CM | POA: Diagnosis not present

## 2021-10-15 DIAGNOSIS — E7800 Pure hypercholesterolemia, unspecified: Secondary | ICD-10-CM | POA: Diagnosis not present

## 2021-10-15 DIAGNOSIS — E039 Hypothyroidism, unspecified: Secondary | ICD-10-CM | POA: Diagnosis not present

## 2021-10-24 DIAGNOSIS — Z2821 Immunization not carried out because of patient refusal: Secondary | ICD-10-CM | POA: Diagnosis not present

## 2021-10-24 DIAGNOSIS — R002 Palpitations: Secondary | ICD-10-CM | POA: Diagnosis not present

## 2021-10-24 DIAGNOSIS — Z6829 Body mass index (BMI) 29.0-29.9, adult: Secondary | ICD-10-CM | POA: Diagnosis not present

## 2021-10-31 DIAGNOSIS — L84 Corns and callosities: Secondary | ICD-10-CM | POA: Diagnosis not present

## 2021-10-31 DIAGNOSIS — M89372 Hypertrophy of bone, left ankle and foot: Secondary | ICD-10-CM | POA: Diagnosis not present

## 2021-11-06 DIAGNOSIS — R002 Palpitations: Secondary | ICD-10-CM | POA: Diagnosis not present

## 2021-11-07 DIAGNOSIS — N952 Postmenopausal atrophic vaginitis: Secondary | ICD-10-CM | POA: Diagnosis not present

## 2021-11-07 DIAGNOSIS — R159 Full incontinence of feces: Secondary | ICD-10-CM | POA: Diagnosis not present

## 2021-11-07 DIAGNOSIS — N816 Rectocele: Secondary | ICD-10-CM | POA: Diagnosis not present

## 2021-12-13 DIAGNOSIS — R030 Elevated blood-pressure reading, without diagnosis of hypertension: Secondary | ICD-10-CM | POA: Diagnosis not present

## 2021-12-13 DIAGNOSIS — R32 Unspecified urinary incontinence: Secondary | ICD-10-CM | POA: Diagnosis not present

## 2021-12-13 DIAGNOSIS — E039 Hypothyroidism, unspecified: Secondary | ICD-10-CM | POA: Diagnosis not present

## 2021-12-13 DIAGNOSIS — Z6830 Body mass index (BMI) 30.0-30.9, adult: Secondary | ICD-10-CM | POA: Diagnosis not present

## 2021-12-13 DIAGNOSIS — N952 Postmenopausal atrophic vaginitis: Secondary | ICD-10-CM | POA: Diagnosis not present

## 2021-12-13 DIAGNOSIS — M1990 Unspecified osteoarthritis, unspecified site: Secondary | ICD-10-CM | POA: Diagnosis not present

## 2021-12-13 DIAGNOSIS — Z604 Social exclusion and rejection: Secondary | ICD-10-CM | POA: Diagnosis not present

## 2021-12-13 DIAGNOSIS — E669 Obesity, unspecified: Secondary | ICD-10-CM | POA: Diagnosis not present

## 2021-12-13 DIAGNOSIS — K219 Gastro-esophageal reflux disease without esophagitis: Secondary | ICD-10-CM | POA: Diagnosis not present

## 2022-01-29 DIAGNOSIS — M19031 Primary osteoarthritis, right wrist: Secondary | ICD-10-CM | POA: Diagnosis not present

## 2022-01-29 DIAGNOSIS — R52 Pain, unspecified: Secondary | ICD-10-CM | POA: Diagnosis not present

## 2022-01-29 DIAGNOSIS — G5601 Carpal tunnel syndrome, right upper limb: Secondary | ICD-10-CM | POA: Diagnosis not present

## 2022-01-29 DIAGNOSIS — M13841 Other specified arthritis, right hand: Secondary | ICD-10-CM | POA: Diagnosis not present

## 2022-04-11 DIAGNOSIS — R0782 Intercostal pain: Secondary | ICD-10-CM | POA: Diagnosis not present

## 2022-04-11 DIAGNOSIS — S2239XB Fracture of one rib, unspecified side, initial encounter for open fracture: Secondary | ICD-10-CM | POA: Diagnosis not present

## 2022-04-12 ENCOUNTER — Emergency Department (HOSPITAL_BASED_OUTPATIENT_CLINIC_OR_DEPARTMENT_OTHER): Payer: Medicare PPO

## 2022-04-12 ENCOUNTER — Other Ambulatory Visit: Payer: Self-pay

## 2022-04-12 ENCOUNTER — Emergency Department (HOSPITAL_BASED_OUTPATIENT_CLINIC_OR_DEPARTMENT_OTHER)
Admission: EM | Admit: 2022-04-12 | Discharge: 2022-04-12 | Disposition: A | Discharge status: Home / Self Care | Payer: Medicare PPO | Attending: Emergency Medicine | Admitting: Emergency Medicine

## 2022-04-12 ENCOUNTER — Encounter (HOSPITAL_BASED_OUTPATIENT_CLINIC_OR_DEPARTMENT_OTHER): Payer: Self-pay | Admitting: Emergency Medicine

## 2022-04-12 DIAGNOSIS — K5792 Diverticulitis of intestine, part unspecified, without perforation or abscess without bleeding: Secondary | ICD-10-CM

## 2022-04-12 DIAGNOSIS — S0083XA Contusion of other part of head, initial encounter: Secondary | ICD-10-CM

## 2022-04-12 DIAGNOSIS — S0990XA Unspecified injury of head, initial encounter: Secondary | ICD-10-CM | POA: Diagnosis not present

## 2022-04-12 DIAGNOSIS — M25551 Pain in right hip: Secondary | ICD-10-CM

## 2022-04-12 DIAGNOSIS — R519 Headache, unspecified: Secondary | ICD-10-CM | POA: Insufficient documentation

## 2022-04-12 DIAGNOSIS — S0993XA Unspecified injury of face, initial encounter: Secondary | ICD-10-CM | POA: Diagnosis present

## 2022-04-12 DIAGNOSIS — W01198A Fall on same level from slipping, tripping and stumbling with subsequent striking against other object, initial encounter: Secondary | ICD-10-CM | POA: Insufficient documentation

## 2022-04-12 DIAGNOSIS — K5730 Diverticulosis of large intestine without perforation or abscess without bleeding: Secondary | ICD-10-CM | POA: Diagnosis not present

## 2022-04-12 DIAGNOSIS — I700 Atherosclerosis of aorta: Secondary | ICD-10-CM | POA: Diagnosis not present

## 2022-04-12 DIAGNOSIS — I2510 Atherosclerotic heart disease of native coronary artery without angina pectoris: Secondary | ICD-10-CM | POA: Diagnosis not present

## 2022-04-12 DIAGNOSIS — J8410 Pulmonary fibrosis, unspecified: Secondary | ICD-10-CM | POA: Diagnosis not present

## 2022-04-12 DIAGNOSIS — R0781 Pleurodynia: Secondary | ICD-10-CM | POA: Diagnosis not present

## 2022-04-12 DIAGNOSIS — R918 Other nonspecific abnormal finding of lung field: Secondary | ICD-10-CM | POA: Diagnosis not present

## 2022-04-12 DIAGNOSIS — Z9889 Other specified postprocedural states: Secondary | ICD-10-CM | POA: Diagnosis not present

## 2022-04-12 DIAGNOSIS — S7001XA Contusion of right hip, initial encounter: Secondary | ICD-10-CM | POA: Diagnosis not present

## 2022-04-12 DIAGNOSIS — W19XXXA Unspecified fall, initial encounter: Secondary | ICD-10-CM

## 2022-04-12 LAB — COMPREHENSIVE METABOLIC PANEL
ALT: 14 U/L (ref 0–44)
AST: 20 U/L (ref 15–41)
Albumin: 3.9 g/dL (ref 3.5–5.0)
Alkaline Phosphatase: 86 U/L (ref 38–126)
Anion gap: 8 (ref 5–15)
BUN: 16 mg/dL (ref 8–23)
CO2: 26 mmol/L (ref 22–32)
Calcium: 9.1 mg/dL (ref 8.9–10.3)
Chloride: 102 mmol/L (ref 98–111)
Creatinine, Ser: 0.66 mg/dL (ref 0.44–1.00)
GFR, Estimated: >60 mL/min (ref >60)
Glucose, Bld: 106 mg/dL — ABNORMAL HIGH (ref 70–99)
Potassium: 3.6 mmol/L (ref 3.5–5.1)
Sodium: 136 mmol/L (ref 135–145)
Total Bilirubin: 1.1 mg/dL (ref 0.3–1.2)
Total Protein: 7 g/dL (ref 6.5–8.1)

## 2022-04-12 LAB — URINALYSIS, ROUTINE W REFLEX MICROSCOPIC
Bilirubin Urine: NEGATIVE
Glucose, UA: NEGATIVE mg/dL
Hgb urine dipstick: NEGATIVE
Ketones, ur: NEGATIVE mg/dL
Leukocytes,Ua: NEGATIVE
Nitrite: NEGATIVE
Protein, ur: NEGATIVE mg/dL
Specific Gravity, Urine: 1.01 (ref 1.005–1.030)
pH: 6.5 (ref 5.0–8.0)

## 2022-04-12 LAB — CBC WITH DIFFERENTIAL/PLATELET
Abs Immature Granulocytes: 0.03 10*3/uL (ref 0.00–0.07)
Basophils Absolute: 0 10*3/uL (ref 0.0–0.1)
Basophils Relative: 0 %
Eosinophils Absolute: 0 10*3/uL (ref 0.0–0.5)
Eosinophils Relative: 0 %
HCT: 36.4 % (ref 36.0–46.0)
Hemoglobin: 12.5 g/dL (ref 12.0–15.0)
Immature Granulocytes: 0 %
Lymphocytes Relative: 12 %
Lymphs Abs: 1.2 10*3/uL (ref 0.7–4.0)
MCH: 30 pg (ref 26.0–34.0)
MCHC: 34.3 g/dL (ref 30.0–36.0)
MCV: 87.5 fL (ref 80.0–100.0)
Monocytes Absolute: 1 10*3/uL (ref 0.1–1.0)
Monocytes Relative: 9 %
Neutro Abs: 8 10*3/uL — ABNORMAL HIGH (ref 1.7–7.7)
Neutrophils Relative %: 79 %
Platelets: 231 10*3/uL (ref 150–400)
RBC: 4.16 MIL/uL (ref 3.87–5.11)
RDW: 13.2 % (ref 11.5–15.5)
WBC: 10.3 10*3/uL (ref 4.0–10.5)
nRBC: 0 % (ref 0.0–0.2)

## 2022-04-12 LAB — LACTIC ACID, PLASMA: Lactic Acid, Venous: 0.6 mmol/L (ref 0.5–1.9)

## 2022-04-12 MED ORDER — IOHEXOL 300 MG/ML  SOLN
100.0000 mL | Freq: Once | INTRAMUSCULAR | Status: AC | PRN
  Administered 2022-04-12: 100 mL via INTRAVENOUS

## 2022-04-12 MED ORDER — AMOXICILLIN-POT CLAVULANATE 875-125 MG PO TABS: 1.0000 | ORAL_TABLET | Freq: Two times a day (BID) | ORAL | 0 refills | Status: DC

## 2022-04-12 NOTE — ED Provider Notes (Signed)
[place] EMERGENCY DEPARTMENT Provider Note   CSN: [id number] Arrival date & time: 04/12/22  1627     History {Add pertinent medical, surgical, social history, OB history to HPI:1} Chief Complaint  Patient presents with   Robyn Lambert is a 81 y.o. female.  She is brought in by family member for evaluation of injuries from a fall.  She said she had a mechanical fall when her foot stuck on something and she fell hitting her left forehead.  This occurred yesterday.  She has noticed some pain in the left side of her chest wall and in her right hip.  She went to urgent care and she said they gave her a cortisone shot.  Starting last night she had some low abdominal pain and some stool with some mucus that had some blood in it.  She has had a low-grade fever to 100.  No urinary symptoms no numbness no weakness.  No cough or shortness of breath no nausea or vomiting  The history is provided by the patient.  Fall This is a new problem. The current episode started yesterday. The problem has not changed since onset.Associated symptoms include chest pain, abdominal pain and headaches. Pertinent negatives include no shortness of breath. The symptoms are aggravated by bending, twisting and coughing. Nothing relieves the symptoms. She has tried rest for the symptoms. The treatment provided mild relief.       Home Medications Prior to Admission medications   Medication Sig Start Date End Date Taking? Authorizing Provider  acetaminophen (TYLENOL) 650 MG CR tablet Take 650-1,300 mg by mouth every 8 (eight) hours as needed for pain.    [provider]  Ascorbic Acid (VITAMIN C) 1000 MG tablet Take 1,000 mg by mouth daily.    [provider]  BIOTIN PO Take 1 capsule by mouth daily.    [provider]  CALCIUM PO Take 3,000 mg by mouth at bedtime. 1000 mg/capsule    [provider]  Cholecalciferol (VITAMIN D3) 125 MCG (5000 UT) TABS Take 5,000  Units by mouth daily.    [provider]  gabapentin (NEURONTIN) 100 MG capsule Take a 100 mg capsule three times a day for two weeks following surgery.Then take a 100 mg capsule two times a day for two weeks. Then take a 100 mg capsule once a day for two weeks. Then discontinue. 02/21/20   Edmisten, Kristie L, PA  Glucosamine-Chondroitin (COSAMIN DS PO) Take 2 tablets by mouth daily.    [provider]  levothyroxine (SYNTHROID) 88 MCG tablet Take 88 mcg by mouth every evening. 12/26/19   [provider]  LINOLEIC ACID-SUNFLOWER OIL PO Take 2,400 mg by mouth at bedtime. 1200 mg/capsule    [provider]  Melatonin 10 MG TABS Take 10 mg by mouth at bedtime.     [provider]  methocarbamol (ROBAXIN) 500 MG tablet Take 1 tablet (500 mg total) by mouth every 6 (six) hours as needed for muscle spasms. 02/21/20   Edmisten, Ok Anis, PA  Multiple Vitamin (MULTIVITAMIN WITH MINERALS) TABS tablet Take 1 tablet by mouth daily.    [provider]  NAT-RUL PSYLLIUM SEED HUSKS PO Take 3 capsules by mouth at bedtime.    [provider]  Omega-3 Fatty Acids (FISH OIL PO) Take 2,500 mg by mouth at bedtime.    [provider]  OVER THE COUNTER MEDICATION Take 2 tablets by mouth daily as needed (anxiety.). [facility] [place]  [provider]  oxyCODONE (OXY IR/ROXICODONE) 5 MG immediate release tablet Take 1-2 tablets (5-10 mg total) by mouth every 6 (six) hours as needed for severe pain. 02/21/20   Edmisten, Kristie L, PA  polyethylene glycol (MIRALAX / GLYCOLAX) 17 g packet Take 8.5 g by mouth daily.    [provider]  Probiotic Product (PROBIOTIC PO) Take 1 capsule by mouth daily after breakfast.    [provider]  QUERCETIN PO Take 500 mg by mouth daily.    [provider]  Red Yeast Rice 600 MG TABS Take 600 mg by mouth daily.    [provider]  Specialty Vitamins Products (BRAIN) TABS Take 2  tablets by mouth daily. Curamed Brain    [provider]  traMADol (ULTRAM) 50 MG tablet Take 1-2 tablets (50-100 mg total) by mouth every 6 (six) hours as needed for moderate pain. 02/21/20   Edmisten, Ok Anis, PA  Vitamin D-Vitamin K (K2 PLUS D3 PO) Take 1 tablet by mouth daily.    [provider]  Zinc 25 MG TABS Take 25 mg by mouth daily.    [provider]      Allergies    Sulfa antibiotics    Review of Systems   Review of Systems  Constitutional:  Positive for fever.  HENT:  Negative for sore throat.   Eyes:  Negative for visual disturbance.  Respiratory:  Negative for shortness of breath.   Cardiovascular:  Positive for chest pain.  Gastrointestinal:  Positive for abdominal pain and blood in stool. Negative for nausea and vomiting.  Genitourinary:  Negative for dysuria.  Musculoskeletal:  Negative for neck pain.  Skin:  Negative for rash.  Neurological:  Positive for headaches.    Physical Exam Updated Vital Signs BP (!) 147/115 (BP Location: Right Arm)   Pulse 93   Temp 98.6 F (37 C) (Oral)   Resp 17   Ht '5\' 4"'$  (1.626 m)   Wt 81.6 kg   SpO2 97%   BMI 30.90 kg/m  Physical Exam Vitals and nursing note reviewed.  Constitutional:      General: She is not in acute distress.    Appearance: Normal appearance. She is well-developed.  HENT:     Head: Normocephalic and atraumatic.  Eyes:     Conjunctiva/sclera: Conjunctivae normal.  Cardiovascular:     Rate and Rhythm: Normal rate and regular rhythm.     Heart sounds: No murmur heard. Pulmonary:     Effort: Pulmonary effort is normal. No respiratory distress.     Breath sounds: Normal breath sounds.  Abdominal:     Palpations: Abdomen is soft.     Tenderness: There is no abdominal tenderness. There is no guarding or rebound.  Musculoskeletal:        General: Tenderness (right hip) present. Normal range of motion.     Cervical back: Neck supple.     Right lower leg: No edema.      Left lower leg: No edema.  Skin:    General: Skin is warm and dry.     Capillary Refill: Capillary refill takes less than 2 seconds.  Neurological:     General: No focal deficit present.     Mental Status: She is alert.     Sensory: No sensory deficit.     Motor: No weakness.     ED Results / Procedures / Treatments   Labs (all labs ordered are listed, but only abnormal results are displayed) Labs  Reviewed  COMPREHENSIVE METABOLIC PANEL  CBC WITH DIFFERENTIAL/PLATELET  LACTIC ACID, PLASMA  LACTIC ACID, PLASMA  URINALYSIS, ROUTINE W REFLEX MICROSCOPIC    EKG None  Radiology No results found.  Procedures Procedures  {Document cardiac monitor, telemetry assessment procedure when appropriate:1}  Medications Ordered in ED Medications - No data to display  ED Course/ Medical Decision Making/ A&P                           Medical Decision Making Amount and/or Complexity of Data Reviewed Labs: ordered. Radiology: ordered.   This patient complains of ***; this involves an extensive number of treatment Options and is a complaint that carries with it a high risk of complications and morbidity. The differential includes ***  I ordered, reviewed and interpreted labs, which included *** I ordered medication *** and reviewed PMP when indicated. I ordered imaging studies which included *** and I independently    visualized and interpreted imaging which showed *** Additional history obtained from *** Previous records obtained and reviewed *** I consulted *** and discussed lab and imaging findings and discussed disposition.  Cardiac monitoring reviewed, *** Social determinants considered, *** Critical Interventions: ***  After the interventions stated above, I reevaluated the patient and found *** Admission and further testing considered, ***   {Document critical care time when appropriate:1} {Document review of labs and clinical decision tools ie heart score,  Chads2Vasc2 etc:1}  {Document your independent review of radiology images, and any outside records:1} {Document your discussion with family members, caretakers, and with consultants:1} {Document social determinants of health affecting pt's care:1} {Document your decision making why or why not admission, treatments were needed:1} Final Clinical Impression(s) / ED Diagnoses Final diagnoses:  None    Rx / DC Orders ED Discharge Orders     None

## 2022-04-12 NOTE — ED Triage Notes (Addendum)
Mechanical fall yesterday morning, landed on left side. Last night started having lower abdominal pain and bloody stools. Denies blood thinners, LOC. Endorses hitting her left temporal area during fall. Was seen at [facility] yesterday and had negative CXR. Also c/o right hip pain and chest wall pain when deep breaths.

## 2022-04-12 NOTE — Discharge Instructions (Signed)
You were seen in the emergency department for evaluation of lower abdominal pain and fever head injury right hip pain after a fall.  CAT scan of your head was normal, the CAT scan of your abdomen showed acute diverticulitis.  We are putting you on some antibiotics.  Please start with a clear liquid diet advance as tolerated.  Follow-up with your regular doctor.  Return to the emergency department if any worsening or concerning symptoms.

## 2022-04-22 DIAGNOSIS — H9313 Tinnitus, bilateral: Secondary | ICD-10-CM | POA: Diagnosis not present

## 2022-04-22 DIAGNOSIS — J3489 Other specified disorders of nose and nasal sinuses: Secondary | ICD-10-CM | POA: Diagnosis not present

## 2022-04-22 DIAGNOSIS — H903 Sensorineural hearing loss, bilateral: Secondary | ICD-10-CM | POA: Diagnosis not present

## 2022-04-22 DIAGNOSIS — H9190 Unspecified hearing loss, unspecified ear: Secondary | ICD-10-CM | POA: Diagnosis not present

## 2022-04-24 DIAGNOSIS — K5792 Diverticulitis of intestine, part unspecified, without perforation or abscess without bleeding: Secondary | ICD-10-CM | POA: Diagnosis not present

## 2022-04-24 DIAGNOSIS — Z6829 Body mass index (BMI) 29.0-29.9, adult: Secondary | ICD-10-CM | POA: Diagnosis not present

## 2022-05-19 DIAGNOSIS — H43393 Other vitreous opacities, bilateral: Secondary | ICD-10-CM | POA: Diagnosis not present

## 2022-05-19 DIAGNOSIS — H5203 Hypermetropia, bilateral: Secondary | ICD-10-CM | POA: Diagnosis not present

## 2022-05-19 DIAGNOSIS — H53143 Visual discomfort, bilateral: Secondary | ICD-10-CM | POA: Diagnosis not present

## 2022-05-19 DIAGNOSIS — H524 Presbyopia: Secondary | ICD-10-CM | POA: Diagnosis not present

## 2022-05-19 DIAGNOSIS — H52223 Regular astigmatism, bilateral: Secondary | ICD-10-CM | POA: Diagnosis not present

## 2022-07-31 DIAGNOSIS — J069 Acute upper respiratory infection, unspecified: Secondary | ICD-10-CM | POA: Diagnosis not present

## 2022-07-31 DIAGNOSIS — R519 Headache, unspecified: Secondary | ICD-10-CM | POA: Diagnosis not present

## 2022-07-31 DIAGNOSIS — R051 Acute cough: Secondary | ICD-10-CM | POA: Diagnosis not present

## 2022-07-31 DIAGNOSIS — R0981 Nasal congestion: Secondary | ICD-10-CM | POA: Diagnosis not present

## 2022-08-19 DIAGNOSIS — W19XXXA Unspecified fall, initial encounter: Secondary | ICD-10-CM | POA: Diagnosis not present

## 2022-08-19 DIAGNOSIS — M79675 Pain in left toe(s): Secondary | ICD-10-CM | POA: Diagnosis not present

## 2022-08-19 DIAGNOSIS — S0083XA Contusion of other part of head, initial encounter: Secondary | ICD-10-CM | POA: Diagnosis not present

## 2022-09-02 DIAGNOSIS — Z961 Presence of intraocular lens: Secondary | ICD-10-CM | POA: Diagnosis not present

## 2022-09-02 DIAGNOSIS — H02831 Dermatochalasis of right upper eyelid: Secondary | ICD-10-CM | POA: Diagnosis not present

## 2022-09-02 DIAGNOSIS — H26492 Other secondary cataract, left eye: Secondary | ICD-10-CM | POA: Diagnosis not present

## 2022-09-02 DIAGNOSIS — H18413 Arcus senilis, bilateral: Secondary | ICD-10-CM | POA: Diagnosis not present

## 2022-09-09 DIAGNOSIS — H524 Presbyopia: Secondary | ICD-10-CM | POA: Diagnosis not present

## 2022-09-09 DIAGNOSIS — Z961 Presence of intraocular lens: Secondary | ICD-10-CM | POA: Diagnosis not present

## 2022-09-15 DIAGNOSIS — Z1231 Encounter for screening mammogram for malignant neoplasm of breast: Secondary | ICD-10-CM | POA: Diagnosis not present

## 2022-09-18 DIAGNOSIS — W19XXXD Unspecified fall, subsequent encounter: Secondary | ICD-10-CM | POA: Diagnosis not present

## 2022-09-18 DIAGNOSIS — M549 Dorsalgia, unspecified: Secondary | ICD-10-CM | POA: Diagnosis not present

## 2022-09-18 DIAGNOSIS — M25559 Pain in unspecified hip: Secondary | ICD-10-CM | POA: Diagnosis not present

## 2022-09-18 DIAGNOSIS — E039 Hypothyroidism, unspecified: Secondary | ICD-10-CM | POA: Diagnosis not present

## 2022-09-18 DIAGNOSIS — Z79899 Other long term (current) drug therapy: Secondary | ICD-10-CM | POA: Diagnosis not present

## 2022-09-23 DIAGNOSIS — N6012 Diffuse cystic mastopathy of left breast: Secondary | ICD-10-CM | POA: Diagnosis not present

## 2022-09-23 DIAGNOSIS — N6011 Diffuse cystic mastopathy of right breast: Secondary | ICD-10-CM | POA: Diagnosis not present

## 2022-11-03 DIAGNOSIS — Z9849 Cataract extraction status, unspecified eye: Secondary | ICD-10-CM | POA: Diagnosis not present

## 2022-11-03 DIAGNOSIS — Z961 Presence of intraocular lens: Secondary | ICD-10-CM | POA: Diagnosis not present

## 2022-11-03 DIAGNOSIS — H40052 Ocular hypertension, left eye: Secondary | ICD-10-CM | POA: Diagnosis not present

## 2022-11-13 DIAGNOSIS — R197 Diarrhea, unspecified: Secondary | ICD-10-CM | POA: Diagnosis not present

## 2023-01-01 DIAGNOSIS — M79671 Pain in right foot: Secondary | ICD-10-CM | POA: Diagnosis not present

## 2023-03-17 DIAGNOSIS — J342 Deviated nasal septum: Secondary | ICD-10-CM | POA: Diagnosis not present

## 2023-03-17 DIAGNOSIS — J310 Chronic rhinitis: Secondary | ICD-10-CM | POA: Diagnosis not present

## 2023-03-17 DIAGNOSIS — R0981 Nasal congestion: Secondary | ICD-10-CM | POA: Diagnosis not present

## 2023-05-13 DIAGNOSIS — J029 Acute pharyngitis, unspecified: Secondary | ICD-10-CM | POA: Diagnosis not present

## 2023-06-16 DIAGNOSIS — R0789 Other chest pain: Secondary | ICD-10-CM | POA: Diagnosis not present

## 2023-06-16 DIAGNOSIS — K5792 Diverticulitis of intestine, part unspecified, without perforation or abscess without bleeding: Secondary | ICD-10-CM | POA: Diagnosis not present

## 2023-06-16 DIAGNOSIS — E039 Hypothyroidism, unspecified: Secondary | ICD-10-CM | POA: Diagnosis not present

## 2023-06-27 DIAGNOSIS — E785 Hyperlipidemia, unspecified: Secondary | ICD-10-CM | POA: Diagnosis not present

## 2023-06-27 DIAGNOSIS — R0902 Hypoxemia: Secondary | ICD-10-CM | POA: Diagnosis not present

## 2023-06-27 DIAGNOSIS — Z79899 Other long term (current) drug therapy: Secondary | ICD-10-CM | POA: Diagnosis not present

## 2023-06-27 DIAGNOSIS — Z043 Encounter for examination and observation following other accident: Secondary | ICD-10-CM | POA: Diagnosis not present

## 2023-06-27 DIAGNOSIS — W19XXXA Unspecified fall, initial encounter: Secondary | ICD-10-CM | POA: Diagnosis not present

## 2023-06-27 DIAGNOSIS — E039 Hypothyroidism, unspecified: Secondary | ICD-10-CM | POA: Diagnosis not present

## 2023-06-27 DIAGNOSIS — Z7401 Bed confinement status: Secondary | ICD-10-CM | POA: Diagnosis not present

## 2023-06-27 DIAGNOSIS — Z882 Allergy status to sulfonamides status: Secondary | ICD-10-CM | POA: Diagnosis not present

## 2023-06-27 DIAGNOSIS — E7800 Pure hypercholesterolemia, unspecified: Secondary | ICD-10-CM | POA: Diagnosis not present

## 2023-06-27 DIAGNOSIS — S72001A Fracture of unspecified part of neck of right femur, initial encounter for closed fracture: Secondary | ICD-10-CM | POA: Diagnosis not present

## 2023-06-27 DIAGNOSIS — M810 Age-related osteoporosis without current pathological fracture: Secondary | ICD-10-CM | POA: Diagnosis not present

## 2023-06-27 DIAGNOSIS — R2681 Unsteadiness on feet: Secondary | ICD-10-CM | POA: Diagnosis not present

## 2023-06-27 DIAGNOSIS — S7700XA Crushing injury of unspecified hip, initial encounter: Secondary | ICD-10-CM | POA: Diagnosis not present

## 2023-06-27 DIAGNOSIS — D62 Acute posthemorrhagic anemia: Secondary | ICD-10-CM | POA: Diagnosis not present

## 2023-06-27 DIAGNOSIS — I10 Essential (primary) hypertension: Secondary | ICD-10-CM | POA: Diagnosis not present

## 2023-06-27 DIAGNOSIS — M1990 Unspecified osteoarthritis, unspecified site: Secondary | ICD-10-CM | POA: Diagnosis not present

## 2023-06-27 DIAGNOSIS — R2689 Other abnormalities of gait and mobility: Secondary | ICD-10-CM | POA: Diagnosis not present

## 2023-06-27 DIAGNOSIS — Z7982 Long term (current) use of aspirin: Secondary | ICD-10-CM | POA: Diagnosis not present

## 2023-06-27 DIAGNOSIS — S79911A Unspecified injury of right hip, initial encounter: Secondary | ICD-10-CM | POA: Diagnosis not present

## 2023-06-27 DIAGNOSIS — W19XXXD Unspecified fall, subsequent encounter: Secondary | ICD-10-CM | POA: Diagnosis not present

## 2023-06-27 DIAGNOSIS — K219 Gastro-esophageal reflux disease without esophagitis: Secondary | ICD-10-CM | POA: Diagnosis not present

## 2023-06-27 DIAGNOSIS — Z886 Allergy status to analgesic agent status: Secondary | ICD-10-CM | POA: Diagnosis not present

## 2023-06-27 DIAGNOSIS — D509 Iron deficiency anemia, unspecified: Secondary | ICD-10-CM | POA: Diagnosis not present

## 2023-06-27 DIAGNOSIS — S72001D Fracture of unspecified part of neck of right femur, subsequent encounter for closed fracture with routine healing: Secondary | ICD-10-CM | POA: Diagnosis not present

## 2023-06-28 DIAGNOSIS — E7800 Pure hypercholesterolemia, unspecified: Secondary | ICD-10-CM | POA: Diagnosis not present

## 2023-06-28 DIAGNOSIS — D509 Iron deficiency anemia, unspecified: Secondary | ICD-10-CM | POA: Diagnosis not present

## 2023-06-28 DIAGNOSIS — W19XXXA Unspecified fall, initial encounter: Secondary | ICD-10-CM | POA: Diagnosis not present

## 2023-06-28 DIAGNOSIS — S72001A Fracture of unspecified part of neck of right femur, initial encounter for closed fracture: Secondary | ICD-10-CM | POA: Diagnosis not present

## 2023-06-29 DIAGNOSIS — D509 Iron deficiency anemia, unspecified: Secondary | ICD-10-CM | POA: Diagnosis not present

## 2023-06-29 DIAGNOSIS — S72001A Fracture of unspecified part of neck of right femur, initial encounter for closed fracture: Secondary | ICD-10-CM | POA: Diagnosis not present

## 2023-06-29 DIAGNOSIS — E7800 Pure hypercholesterolemia, unspecified: Secondary | ICD-10-CM | POA: Diagnosis not present

## 2023-06-30 DIAGNOSIS — S72001A Fracture of unspecified part of neck of right femur, initial encounter for closed fracture: Secondary | ICD-10-CM | POA: Diagnosis not present

## 2023-06-30 DIAGNOSIS — E7800 Pure hypercholesterolemia, unspecified: Secondary | ICD-10-CM | POA: Diagnosis not present

## 2023-06-30 DIAGNOSIS — D509 Iron deficiency anemia, unspecified: Secondary | ICD-10-CM | POA: Diagnosis not present

## 2023-07-01 DIAGNOSIS — S72001A Fracture of unspecified part of neck of right femur, initial encounter for closed fracture: Secondary | ICD-10-CM | POA: Diagnosis not present

## 2023-07-01 DIAGNOSIS — E7800 Pure hypercholesterolemia, unspecified: Secondary | ICD-10-CM | POA: Diagnosis not present

## 2023-07-01 DIAGNOSIS — D509 Iron deficiency anemia, unspecified: Secondary | ICD-10-CM | POA: Diagnosis not present

## 2023-07-02 DIAGNOSIS — D509 Iron deficiency anemia, unspecified: Secondary | ICD-10-CM | POA: Diagnosis not present

## 2023-07-02 DIAGNOSIS — E7800 Pure hypercholesterolemia, unspecified: Secondary | ICD-10-CM | POA: Diagnosis not present

## 2023-07-02 DIAGNOSIS — S72001A Fracture of unspecified part of neck of right femur, initial encounter for closed fracture: Secondary | ICD-10-CM | POA: Diagnosis not present

## 2023-07-03 DIAGNOSIS — S72001A Fracture of unspecified part of neck of right femur, initial encounter for closed fracture: Secondary | ICD-10-CM | POA: Diagnosis not present

## 2023-07-03 DIAGNOSIS — D509 Iron deficiency anemia, unspecified: Secondary | ICD-10-CM | POA: Diagnosis not present

## 2023-07-03 DIAGNOSIS — E7800 Pure hypercholesterolemia, unspecified: Secondary | ICD-10-CM | POA: Diagnosis not present

## 2023-07-04 DIAGNOSIS — R262 Difficulty in walking, not elsewhere classified: Secondary | ICD-10-CM | POA: Diagnosis not present

## 2023-07-04 DIAGNOSIS — M810 Age-related osteoporosis without current pathological fracture: Secondary | ICD-10-CM | POA: Diagnosis not present

## 2023-07-04 DIAGNOSIS — D509 Iron deficiency anemia, unspecified: Secondary | ICD-10-CM | POA: Diagnosis not present

## 2023-07-04 DIAGNOSIS — E7800 Pure hypercholesterolemia, unspecified: Secondary | ICD-10-CM | POA: Diagnosis not present

## 2023-07-04 DIAGNOSIS — S7700XA Crushing injury of unspecified hip, initial encounter: Secondary | ICD-10-CM | POA: Diagnosis not present

## 2023-07-04 DIAGNOSIS — Z7401 Bed confinement status: Secondary | ICD-10-CM | POA: Diagnosis not present

## 2023-07-04 DIAGNOSIS — W19XXXD Unspecified fall, subsequent encounter: Secondary | ICD-10-CM | POA: Diagnosis not present

## 2023-07-04 DIAGNOSIS — D649 Anemia, unspecified: Secondary | ICD-10-CM | POA: Diagnosis not present

## 2023-07-04 DIAGNOSIS — S72001A Fracture of unspecified part of neck of right femur, initial encounter for closed fracture: Secondary | ICD-10-CM | POA: Diagnosis not present

## 2023-07-04 DIAGNOSIS — D62 Acute posthemorrhagic anemia: Secondary | ICD-10-CM | POA: Diagnosis not present

## 2023-07-04 DIAGNOSIS — G8918 Other acute postprocedural pain: Secondary | ICD-10-CM | POA: Diagnosis not present

## 2023-07-04 DIAGNOSIS — R2689 Other abnormalities of gait and mobility: Secondary | ICD-10-CM | POA: Diagnosis not present

## 2023-07-04 DIAGNOSIS — S72001D Fracture of unspecified part of neck of right femur, subsequent encounter for closed fracture with routine healing: Secondary | ICD-10-CM | POA: Diagnosis not present

## 2023-07-04 DIAGNOSIS — E785 Hyperlipidemia, unspecified: Secondary | ICD-10-CM | POA: Diagnosis not present

## 2023-07-04 DIAGNOSIS — M1990 Unspecified osteoarthritis, unspecified site: Secondary | ICD-10-CM | POA: Diagnosis not present

## 2023-07-04 DIAGNOSIS — E039 Hypothyroidism, unspecified: Secondary | ICD-10-CM | POA: Diagnosis not present

## 2023-07-04 DIAGNOSIS — R2681 Unsteadiness on feet: Secondary | ICD-10-CM | POA: Diagnosis not present

## 2023-07-06 DIAGNOSIS — G8918 Other acute postprocedural pain: Secondary | ICD-10-CM | POA: Diagnosis not present

## 2023-07-06 DIAGNOSIS — R262 Difficulty in walking, not elsewhere classified: Secondary | ICD-10-CM | POA: Diagnosis not present

## 2023-07-06 DIAGNOSIS — S72001D Fracture of unspecified part of neck of right femur, subsequent encounter for closed fracture with routine healing: Secondary | ICD-10-CM | POA: Diagnosis not present

## 2023-07-06 DIAGNOSIS — D649 Anemia, unspecified: Secondary | ICD-10-CM | POA: Diagnosis not present

## 2023-07-17 DIAGNOSIS — D509 Iron deficiency anemia, unspecified: Secondary | ICD-10-CM | POA: Diagnosis not present

## 2023-07-17 DIAGNOSIS — K5901 Slow transit constipation: Secondary | ICD-10-CM | POA: Diagnosis not present

## 2023-07-17 DIAGNOSIS — Z7982 Long term (current) use of aspirin: Secondary | ICD-10-CM | POA: Diagnosis not present

## 2023-07-17 DIAGNOSIS — D62 Acute posthemorrhagic anemia: Secondary | ICD-10-CM | POA: Diagnosis not present

## 2023-07-17 DIAGNOSIS — M519 Unspecified thoracic, thoracolumbar and lumbosacral intervertebral disc disorder: Secondary | ICD-10-CM | POA: Diagnosis not present

## 2023-07-17 DIAGNOSIS — K219 Gastro-esophageal reflux disease without esophagitis: Secondary | ICD-10-CM | POA: Diagnosis not present

## 2023-07-17 DIAGNOSIS — K5790 Diverticulosis of intestine, part unspecified, without perforation or abscess without bleeding: Secondary | ICD-10-CM | POA: Diagnosis not present

## 2023-07-17 DIAGNOSIS — Z9181 History of falling: Secondary | ICD-10-CM | POA: Diagnosis not present

## 2023-07-17 DIAGNOSIS — E039 Hypothyroidism, unspecified: Secondary | ICD-10-CM | POA: Diagnosis not present

## 2023-07-17 DIAGNOSIS — M80051D Age-related osteoporosis with current pathological fracture, right femur, subsequent encounter for fracture with routine healing: Secondary | ICD-10-CM | POA: Diagnosis not present

## 2023-07-17 DIAGNOSIS — Z96641 Presence of right artificial hip joint: Secondary | ICD-10-CM | POA: Diagnosis not present

## 2023-07-17 DIAGNOSIS — M159 Polyosteoarthritis, unspecified: Secondary | ICD-10-CM | POA: Diagnosis not present

## 2023-07-17 DIAGNOSIS — E7800 Pure hypercholesterolemia, unspecified: Secondary | ICD-10-CM | POA: Diagnosis not present

## 2023-07-17 DIAGNOSIS — M419 Scoliosis, unspecified: Secondary | ICD-10-CM | POA: Diagnosis not present

## 2023-07-21 DIAGNOSIS — Z0000 Encounter for general adult medical examination without abnormal findings: Secondary | ICD-10-CM | POA: Diagnosis not present

## 2023-07-21 DIAGNOSIS — Z09 Encounter for follow-up examination after completed treatment for conditions other than malignant neoplasm: Secondary | ICD-10-CM | POA: Diagnosis not present

## 2023-07-21 DIAGNOSIS — E039 Hypothyroidism, unspecified: Secondary | ICD-10-CM | POA: Diagnosis not present

## 2023-07-22 DIAGNOSIS — M419 Scoliosis, unspecified: Secondary | ICD-10-CM | POA: Diagnosis not present

## 2023-07-22 DIAGNOSIS — E7800 Pure hypercholesterolemia, unspecified: Secondary | ICD-10-CM | POA: Diagnosis not present

## 2023-07-22 DIAGNOSIS — M519 Unspecified thoracic, thoracolumbar and lumbosacral intervertebral disc disorder: Secondary | ICD-10-CM | POA: Diagnosis not present

## 2023-07-22 DIAGNOSIS — Z7982 Long term (current) use of aspirin: Secondary | ICD-10-CM | POA: Diagnosis not present

## 2023-07-22 DIAGNOSIS — M80051D Age-related osteoporosis with current pathological fracture, right femur, subsequent encounter for fracture with routine healing: Secondary | ICD-10-CM | POA: Diagnosis not present

## 2023-07-22 DIAGNOSIS — M159 Polyosteoarthritis, unspecified: Secondary | ICD-10-CM | POA: Diagnosis not present

## 2023-07-22 DIAGNOSIS — Z96641 Presence of right artificial hip joint: Secondary | ICD-10-CM | POA: Diagnosis not present

## 2023-07-22 DIAGNOSIS — Z9181 History of falling: Secondary | ICD-10-CM | POA: Diagnosis not present

## 2023-07-22 DIAGNOSIS — E039 Hypothyroidism, unspecified: Secondary | ICD-10-CM | POA: Diagnosis not present

## 2023-07-22 DIAGNOSIS — K5901 Slow transit constipation: Secondary | ICD-10-CM | POA: Diagnosis not present

## 2023-07-22 DIAGNOSIS — D62 Acute posthemorrhagic anemia: Secondary | ICD-10-CM | POA: Diagnosis not present

## 2023-07-22 DIAGNOSIS — K5790 Diverticulosis of intestine, part unspecified, without perforation or abscess without bleeding: Secondary | ICD-10-CM | POA: Diagnosis not present

## 2023-07-22 DIAGNOSIS — D509 Iron deficiency anemia, unspecified: Secondary | ICD-10-CM | POA: Diagnosis not present

## 2023-07-22 DIAGNOSIS — K219 Gastro-esophageal reflux disease without esophagitis: Secondary | ICD-10-CM | POA: Diagnosis not present

## 2023-07-24 DIAGNOSIS — E039 Hypothyroidism, unspecified: Secondary | ICD-10-CM | POA: Diagnosis not present

## 2023-07-24 DIAGNOSIS — D62 Acute posthemorrhagic anemia: Secondary | ICD-10-CM | POA: Diagnosis not present

## 2023-07-24 DIAGNOSIS — M159 Polyosteoarthritis, unspecified: Secondary | ICD-10-CM | POA: Diagnosis not present

## 2023-07-24 DIAGNOSIS — Z96641 Presence of right artificial hip joint: Secondary | ICD-10-CM | POA: Diagnosis not present

## 2023-07-24 DIAGNOSIS — E7800 Pure hypercholesterolemia, unspecified: Secondary | ICD-10-CM | POA: Diagnosis not present

## 2023-07-24 DIAGNOSIS — M80051D Age-related osteoporosis with current pathological fracture, right femur, subsequent encounter for fracture with routine healing: Secondary | ICD-10-CM | POA: Diagnosis not present

## 2023-07-24 DIAGNOSIS — M519 Unspecified thoracic, thoracolumbar and lumbosacral intervertebral disc disorder: Secondary | ICD-10-CM | POA: Diagnosis not present

## 2023-07-24 DIAGNOSIS — K5790 Diverticulosis of intestine, part unspecified, without perforation or abscess without bleeding: Secondary | ICD-10-CM | POA: Diagnosis not present

## 2023-07-24 DIAGNOSIS — M419 Scoliosis, unspecified: Secondary | ICD-10-CM | POA: Diagnosis not present

## 2023-07-24 DIAGNOSIS — K5901 Slow transit constipation: Secondary | ICD-10-CM | POA: Diagnosis not present

## 2023-07-24 DIAGNOSIS — K219 Gastro-esophageal reflux disease without esophagitis: Secondary | ICD-10-CM | POA: Diagnosis not present

## 2023-07-24 DIAGNOSIS — Z7982 Long term (current) use of aspirin: Secondary | ICD-10-CM | POA: Diagnosis not present

## 2023-07-24 DIAGNOSIS — Z9181 History of falling: Secondary | ICD-10-CM | POA: Diagnosis not present

## 2023-07-24 DIAGNOSIS — D509 Iron deficiency anemia, unspecified: Secondary | ICD-10-CM | POA: Diagnosis not present

## 2023-07-29 DIAGNOSIS — K5790 Diverticulosis of intestine, part unspecified, without perforation or abscess without bleeding: Secondary | ICD-10-CM | POA: Diagnosis not present

## 2023-07-29 DIAGNOSIS — M159 Polyosteoarthritis, unspecified: Secondary | ICD-10-CM | POA: Diagnosis not present

## 2023-07-29 DIAGNOSIS — Z96641 Presence of right artificial hip joint: Secondary | ICD-10-CM | POA: Diagnosis not present

## 2023-07-29 DIAGNOSIS — D62 Acute posthemorrhagic anemia: Secondary | ICD-10-CM | POA: Diagnosis not present

## 2023-07-29 DIAGNOSIS — E7800 Pure hypercholesterolemia, unspecified: Secondary | ICD-10-CM | POA: Diagnosis not present

## 2023-07-29 DIAGNOSIS — M80051D Age-related osteoporosis with current pathological fracture, right femur, subsequent encounter for fracture with routine healing: Secondary | ICD-10-CM | POA: Diagnosis not present

## 2023-07-29 DIAGNOSIS — M419 Scoliosis, unspecified: Secondary | ICD-10-CM | POA: Diagnosis not present

## 2023-07-29 DIAGNOSIS — E039 Hypothyroidism, unspecified: Secondary | ICD-10-CM | POA: Diagnosis not present

## 2023-07-29 DIAGNOSIS — D509 Iron deficiency anemia, unspecified: Secondary | ICD-10-CM | POA: Diagnosis not present

## 2023-07-29 DIAGNOSIS — K5901 Slow transit constipation: Secondary | ICD-10-CM | POA: Diagnosis not present

## 2023-07-29 DIAGNOSIS — K219 Gastro-esophageal reflux disease without esophagitis: Secondary | ICD-10-CM | POA: Diagnosis not present

## 2023-07-29 DIAGNOSIS — Z9181 History of falling: Secondary | ICD-10-CM | POA: Diagnosis not present

## 2023-07-29 DIAGNOSIS — M519 Unspecified thoracic, thoracolumbar and lumbosacral intervertebral disc disorder: Secondary | ICD-10-CM | POA: Diagnosis not present

## 2023-07-29 DIAGNOSIS — Z7982 Long term (current) use of aspirin: Secondary | ICD-10-CM | POA: Diagnosis not present

## 2023-07-31 DIAGNOSIS — M159 Polyosteoarthritis, unspecified: Secondary | ICD-10-CM | POA: Diagnosis not present

## 2023-07-31 DIAGNOSIS — M80051D Age-related osteoporosis with current pathological fracture, right femur, subsequent encounter for fracture with routine healing: Secondary | ICD-10-CM | POA: Diagnosis not present

## 2023-07-31 DIAGNOSIS — Z9181 History of falling: Secondary | ICD-10-CM | POA: Diagnosis not present

## 2023-07-31 DIAGNOSIS — Z7982 Long term (current) use of aspirin: Secondary | ICD-10-CM | POA: Diagnosis not present

## 2023-07-31 DIAGNOSIS — D62 Acute posthemorrhagic anemia: Secondary | ICD-10-CM | POA: Diagnosis not present

## 2023-07-31 DIAGNOSIS — K5901 Slow transit constipation: Secondary | ICD-10-CM | POA: Diagnosis not present

## 2023-07-31 DIAGNOSIS — M519 Unspecified thoracic, thoracolumbar and lumbosacral intervertebral disc disorder: Secondary | ICD-10-CM | POA: Diagnosis not present

## 2023-07-31 DIAGNOSIS — Z96641 Presence of right artificial hip joint: Secondary | ICD-10-CM | POA: Diagnosis not present

## 2023-07-31 DIAGNOSIS — K219 Gastro-esophageal reflux disease without esophagitis: Secondary | ICD-10-CM | POA: Diagnosis not present

## 2023-07-31 DIAGNOSIS — E7800 Pure hypercholesterolemia, unspecified: Secondary | ICD-10-CM | POA: Diagnosis not present

## 2023-07-31 DIAGNOSIS — E039 Hypothyroidism, unspecified: Secondary | ICD-10-CM | POA: Diagnosis not present

## 2023-07-31 DIAGNOSIS — D509 Iron deficiency anemia, unspecified: Secondary | ICD-10-CM | POA: Diagnosis not present

## 2023-07-31 DIAGNOSIS — M419 Scoliosis, unspecified: Secondary | ICD-10-CM | POA: Diagnosis not present

## 2023-07-31 DIAGNOSIS — K5790 Diverticulosis of intestine, part unspecified, without perforation or abscess without bleeding: Secondary | ICD-10-CM | POA: Diagnosis not present

## 2023-08-03 DIAGNOSIS — E039 Hypothyroidism, unspecified: Secondary | ICD-10-CM | POA: Diagnosis not present

## 2023-08-03 DIAGNOSIS — E7800 Pure hypercholesterolemia, unspecified: Secondary | ICD-10-CM | POA: Diagnosis not present

## 2023-08-03 DIAGNOSIS — M419 Scoliosis, unspecified: Secondary | ICD-10-CM | POA: Diagnosis not present

## 2023-08-03 DIAGNOSIS — K219 Gastro-esophageal reflux disease without esophagitis: Secondary | ICD-10-CM | POA: Diagnosis not present

## 2023-08-03 DIAGNOSIS — D509 Iron deficiency anemia, unspecified: Secondary | ICD-10-CM | POA: Diagnosis not present

## 2023-08-03 DIAGNOSIS — Z96641 Presence of right artificial hip joint: Secondary | ICD-10-CM | POA: Diagnosis not present

## 2023-08-03 DIAGNOSIS — M159 Polyosteoarthritis, unspecified: Secondary | ICD-10-CM | POA: Diagnosis not present

## 2023-08-03 DIAGNOSIS — Z9181 History of falling: Secondary | ICD-10-CM | POA: Diagnosis not present

## 2023-08-03 DIAGNOSIS — M80051D Age-related osteoporosis with current pathological fracture, right femur, subsequent encounter for fracture with routine healing: Secondary | ICD-10-CM | POA: Diagnosis not present

## 2023-08-03 DIAGNOSIS — Z7982 Long term (current) use of aspirin: Secondary | ICD-10-CM | POA: Diagnosis not present

## 2023-08-03 DIAGNOSIS — D62 Acute posthemorrhagic anemia: Secondary | ICD-10-CM | POA: Diagnosis not present

## 2023-08-03 DIAGNOSIS — K5790 Diverticulosis of intestine, part unspecified, without perforation or abscess without bleeding: Secondary | ICD-10-CM | POA: Diagnosis not present

## 2023-08-03 DIAGNOSIS — M519 Unspecified thoracic, thoracolumbar and lumbosacral intervertebral disc disorder: Secondary | ICD-10-CM | POA: Diagnosis not present

## 2023-08-03 DIAGNOSIS — K5901 Slow transit constipation: Secondary | ICD-10-CM | POA: Diagnosis not present

## 2023-08-06 DIAGNOSIS — Z9181 History of falling: Secondary | ICD-10-CM | POA: Diagnosis not present

## 2023-08-06 DIAGNOSIS — E039 Hypothyroidism, unspecified: Secondary | ICD-10-CM | POA: Diagnosis not present

## 2023-08-06 DIAGNOSIS — Z7982 Long term (current) use of aspirin: Secondary | ICD-10-CM | POA: Diagnosis not present

## 2023-08-06 DIAGNOSIS — D62 Acute posthemorrhagic anemia: Secondary | ICD-10-CM | POA: Diagnosis not present

## 2023-08-06 DIAGNOSIS — M80051D Age-related osteoporosis with current pathological fracture, right femur, subsequent encounter for fracture with routine healing: Secondary | ICD-10-CM | POA: Diagnosis not present

## 2023-08-06 DIAGNOSIS — K5790 Diverticulosis of intestine, part unspecified, without perforation or abscess without bleeding: Secondary | ICD-10-CM | POA: Diagnosis not present

## 2023-08-06 DIAGNOSIS — K219 Gastro-esophageal reflux disease without esophagitis: Secondary | ICD-10-CM | POA: Diagnosis not present

## 2023-08-06 DIAGNOSIS — Z96641 Presence of right artificial hip joint: Secondary | ICD-10-CM | POA: Diagnosis not present

## 2023-08-06 DIAGNOSIS — D509 Iron deficiency anemia, unspecified: Secondary | ICD-10-CM | POA: Diagnosis not present

## 2023-08-06 DIAGNOSIS — E7800 Pure hypercholesterolemia, unspecified: Secondary | ICD-10-CM | POA: Diagnosis not present

## 2023-08-06 DIAGNOSIS — M519 Unspecified thoracic, thoracolumbar and lumbosacral intervertebral disc disorder: Secondary | ICD-10-CM | POA: Diagnosis not present

## 2023-08-06 DIAGNOSIS — M419 Scoliosis, unspecified: Secondary | ICD-10-CM | POA: Diagnosis not present

## 2023-08-06 DIAGNOSIS — K5901 Slow transit constipation: Secondary | ICD-10-CM | POA: Diagnosis not present

## 2023-08-06 DIAGNOSIS — M159 Polyosteoarthritis, unspecified: Secondary | ICD-10-CM | POA: Diagnosis not present

## 2023-08-07 DIAGNOSIS — K5790 Diverticulosis of intestine, part unspecified, without perforation or abscess without bleeding: Secondary | ICD-10-CM | POA: Diagnosis not present

## 2023-08-07 DIAGNOSIS — D62 Acute posthemorrhagic anemia: Secondary | ICD-10-CM | POA: Diagnosis not present

## 2023-08-07 DIAGNOSIS — Z96641 Presence of right artificial hip joint: Secondary | ICD-10-CM | POA: Diagnosis not present

## 2023-08-07 DIAGNOSIS — M80051D Age-related osteoporosis with current pathological fracture, right femur, subsequent encounter for fracture with routine healing: Secondary | ICD-10-CM | POA: Diagnosis not present

## 2023-08-07 DIAGNOSIS — M419 Scoliosis, unspecified: Secondary | ICD-10-CM | POA: Diagnosis not present

## 2023-08-07 DIAGNOSIS — K219 Gastro-esophageal reflux disease without esophagitis: Secondary | ICD-10-CM | POA: Diagnosis not present

## 2023-08-07 DIAGNOSIS — K5901 Slow transit constipation: Secondary | ICD-10-CM | POA: Diagnosis not present

## 2023-08-07 DIAGNOSIS — R0981 Nasal congestion: Secondary | ICD-10-CM | POA: Diagnosis not present

## 2023-08-07 DIAGNOSIS — Z9181 History of falling: Secondary | ICD-10-CM | POA: Diagnosis not present

## 2023-08-07 DIAGNOSIS — E039 Hypothyroidism, unspecified: Secondary | ICD-10-CM | POA: Diagnosis not present

## 2023-08-07 DIAGNOSIS — M159 Polyosteoarthritis, unspecified: Secondary | ICD-10-CM | POA: Diagnosis not present

## 2023-08-07 DIAGNOSIS — M519 Unspecified thoracic, thoracolumbar and lumbosacral intervertebral disc disorder: Secondary | ICD-10-CM | POA: Diagnosis not present

## 2023-08-07 DIAGNOSIS — R059 Cough, unspecified: Secondary | ICD-10-CM | POA: Diagnosis not present

## 2023-08-07 DIAGNOSIS — E7800 Pure hypercholesterolemia, unspecified: Secondary | ICD-10-CM | POA: Diagnosis not present

## 2023-08-07 DIAGNOSIS — Z7982 Long term (current) use of aspirin: Secondary | ICD-10-CM | POA: Diagnosis not present

## 2023-08-07 DIAGNOSIS — D509 Iron deficiency anemia, unspecified: Secondary | ICD-10-CM | POA: Diagnosis not present

## 2023-08-10 DIAGNOSIS — M159 Polyosteoarthritis, unspecified: Secondary | ICD-10-CM | POA: Diagnosis not present

## 2023-08-10 DIAGNOSIS — K5901 Slow transit constipation: Secondary | ICD-10-CM | POA: Diagnosis not present

## 2023-08-10 DIAGNOSIS — E7800 Pure hypercholesterolemia, unspecified: Secondary | ICD-10-CM | POA: Diagnosis not present

## 2023-08-10 DIAGNOSIS — E039 Hypothyroidism, unspecified: Secondary | ICD-10-CM | POA: Diagnosis not present

## 2023-08-10 DIAGNOSIS — Z96641 Presence of right artificial hip joint: Secondary | ICD-10-CM | POA: Diagnosis not present

## 2023-08-10 DIAGNOSIS — M80051D Age-related osteoporosis with current pathological fracture, right femur, subsequent encounter for fracture with routine healing: Secondary | ICD-10-CM | POA: Diagnosis not present

## 2023-08-10 DIAGNOSIS — M519 Unspecified thoracic, thoracolumbar and lumbosacral intervertebral disc disorder: Secondary | ICD-10-CM | POA: Diagnosis not present

## 2023-08-10 DIAGNOSIS — Z9181 History of falling: Secondary | ICD-10-CM | POA: Diagnosis not present

## 2023-08-10 DIAGNOSIS — K219 Gastro-esophageal reflux disease without esophagitis: Secondary | ICD-10-CM | POA: Diagnosis not present

## 2023-08-10 DIAGNOSIS — Z7982 Long term (current) use of aspirin: Secondary | ICD-10-CM | POA: Diagnosis not present

## 2023-08-10 DIAGNOSIS — M419 Scoliosis, unspecified: Secondary | ICD-10-CM | POA: Diagnosis not present

## 2023-08-10 DIAGNOSIS — D62 Acute posthemorrhagic anemia: Secondary | ICD-10-CM | POA: Diagnosis not present

## 2023-08-10 DIAGNOSIS — K5790 Diverticulosis of intestine, part unspecified, without perforation or abscess without bleeding: Secondary | ICD-10-CM | POA: Diagnosis not present

## 2023-08-10 DIAGNOSIS — D509 Iron deficiency anemia, unspecified: Secondary | ICD-10-CM | POA: Diagnosis not present

## 2023-08-11 DIAGNOSIS — M159 Polyosteoarthritis, unspecified: Secondary | ICD-10-CM | POA: Diagnosis not present

## 2023-08-11 DIAGNOSIS — E7800 Pure hypercholesterolemia, unspecified: Secondary | ICD-10-CM | POA: Diagnosis not present

## 2023-08-11 DIAGNOSIS — Z96641 Presence of right artificial hip joint: Secondary | ICD-10-CM | POA: Diagnosis not present

## 2023-08-11 DIAGNOSIS — Z7982 Long term (current) use of aspirin: Secondary | ICD-10-CM | POA: Diagnosis not present

## 2023-08-11 DIAGNOSIS — E039 Hypothyroidism, unspecified: Secondary | ICD-10-CM | POA: Diagnosis not present

## 2023-08-11 DIAGNOSIS — D62 Acute posthemorrhagic anemia: Secondary | ICD-10-CM | POA: Diagnosis not present

## 2023-08-11 DIAGNOSIS — Z9181 History of falling: Secondary | ICD-10-CM | POA: Diagnosis not present

## 2023-08-11 DIAGNOSIS — M80051D Age-related osteoporosis with current pathological fracture, right femur, subsequent encounter for fracture with routine healing: Secondary | ICD-10-CM | POA: Diagnosis not present

## 2023-08-11 DIAGNOSIS — K5790 Diverticulosis of intestine, part unspecified, without perforation or abscess without bleeding: Secondary | ICD-10-CM | POA: Diagnosis not present

## 2023-08-11 DIAGNOSIS — M519 Unspecified thoracic, thoracolumbar and lumbosacral intervertebral disc disorder: Secondary | ICD-10-CM | POA: Diagnosis not present

## 2023-08-11 DIAGNOSIS — D509 Iron deficiency anemia, unspecified: Secondary | ICD-10-CM | POA: Diagnosis not present

## 2023-08-11 DIAGNOSIS — K219 Gastro-esophageal reflux disease without esophagitis: Secondary | ICD-10-CM | POA: Diagnosis not present

## 2023-08-11 DIAGNOSIS — K5901 Slow transit constipation: Secondary | ICD-10-CM | POA: Diagnosis not present

## 2023-08-11 DIAGNOSIS — M419 Scoliosis, unspecified: Secondary | ICD-10-CM | POA: Diagnosis not present

## 2023-08-20 DIAGNOSIS — M62551 Muscle wasting and atrophy, not elsewhere classified, right thigh: Secondary | ICD-10-CM | POA: Diagnosis not present

## 2023-08-20 DIAGNOSIS — M25551 Pain in right hip: Secondary | ICD-10-CM | POA: Diagnosis not present

## 2023-08-20 DIAGNOSIS — R2689 Other abnormalities of gait and mobility: Secondary | ICD-10-CM | POA: Diagnosis not present

## 2023-08-23 DIAGNOSIS — L03115 Cellulitis of right lower limb: Secondary | ICD-10-CM | POA: Diagnosis not present

## 2023-08-23 DIAGNOSIS — Z9889 Other specified postprocedural states: Secondary | ICD-10-CM | POA: Diagnosis not present

## 2023-08-23 DIAGNOSIS — Z8781 Personal history of (healed) traumatic fracture: Secondary | ICD-10-CM | POA: Diagnosis not present

## 2023-08-23 DIAGNOSIS — T8149XA Infection following a procedure, other surgical site, initial encounter: Secondary | ICD-10-CM | POA: Diagnosis not present

## 2023-08-23 DIAGNOSIS — Z4802 Encounter for removal of sutures: Secondary | ICD-10-CM | POA: Diagnosis not present

## 2023-08-24 DIAGNOSIS — S72001A Fracture of unspecified part of neck of right femur, initial encounter for closed fracture: Secondary | ICD-10-CM | POA: Diagnosis not present

## 2023-08-24 DIAGNOSIS — T8149XA Infection following a procedure, other surgical site, initial encounter: Secondary | ICD-10-CM | POA: Diagnosis not present

## 2023-08-28 DIAGNOSIS — M62551 Muscle wasting and atrophy, not elsewhere classified, right thigh: Secondary | ICD-10-CM | POA: Diagnosis not present

## 2023-08-28 DIAGNOSIS — M25551 Pain in right hip: Secondary | ICD-10-CM | POA: Diagnosis not present

## 2023-08-28 DIAGNOSIS — R2689 Other abnormalities of gait and mobility: Secondary | ICD-10-CM | POA: Diagnosis not present

## 2023-09-01 DIAGNOSIS — M25551 Pain in right hip: Secondary | ICD-10-CM | POA: Diagnosis not present

## 2023-09-01 DIAGNOSIS — R2689 Other abnormalities of gait and mobility: Secondary | ICD-10-CM | POA: Diagnosis not present

## 2023-09-01 DIAGNOSIS — M62551 Muscle wasting and atrophy, not elsewhere classified, right thigh: Secondary | ICD-10-CM | POA: Diagnosis not present

## 2023-09-02 ENCOUNTER — Emergency Department (HOSPITAL_COMMUNITY): Payer: Medicare Other

## 2023-09-02 ENCOUNTER — Other Ambulatory Visit: Payer: Self-pay

## 2023-09-02 ENCOUNTER — Emergency Department (HOSPITAL_COMMUNITY)
Admission: EM | Admit: 2023-09-02 | Discharge: 2023-09-02 | Disposition: A | Discharge status: Home / Self Care | Payer: Medicare Other | Attending: Emergency Medicine | Admitting: Emergency Medicine

## 2023-09-02 ENCOUNTER — Encounter (HOSPITAL_COMMUNITY): Payer: Self-pay

## 2023-09-02 DIAGNOSIS — Z96641 Presence of right artificial hip joint: Secondary | ICD-10-CM | POA: Diagnosis not present

## 2023-09-02 DIAGNOSIS — Z85828 Personal history of other malignant neoplasm of skin: Secondary | ICD-10-CM | POA: Insufficient documentation

## 2023-09-02 DIAGNOSIS — E039 Hypothyroidism, unspecified: Secondary | ICD-10-CM | POA: Insufficient documentation

## 2023-09-02 DIAGNOSIS — Z471 Aftercare following joint replacement surgery: Secondary | ICD-10-CM | POA: Diagnosis not present

## 2023-09-02 DIAGNOSIS — L03115 Cellulitis of right lower limb: Secondary | ICD-10-CM | POA: Diagnosis not present

## 2023-09-02 DIAGNOSIS — M25551 Pain in right hip: Secondary | ICD-10-CM | POA: Diagnosis not present

## 2023-09-02 LAB — CBC WITH DIFFERENTIAL/PLATELET
Abs Immature Granulocytes: 0.01 10*3/uL (ref 0.00–0.07)
Basophils Absolute: 0 10*3/uL (ref 0.0–0.1)
Basophils Relative: 1 %
Eosinophils Absolute: 0.1 10*3/uL (ref 0.0–0.5)
Eosinophils Relative: 1 %
HCT: 39.1 % (ref 36.0–46.0)
Hemoglobin: 12.5 g/dL (ref 12.0–15.0)
Immature Granulocytes: 0 %
Lymphocytes Relative: 22 %
Lymphs Abs: 1 10*3/uL (ref 0.7–4.0)
MCH: 29.9 pg (ref 26.0–34.0)
MCHC: 32 g/dL (ref 30.0–36.0)
MCV: 93.5 fL (ref 80.0–100.0)
Monocytes Absolute: 0.5 10*3/uL (ref 0.1–1.0)
Monocytes Relative: 11 %
Neutro Abs: 3 10*3/uL (ref 1.7–7.7)
Neutrophils Relative %: 65 %
Platelets: 254 10*3/uL (ref 150–400)
RBC: 4.18 MIL/uL (ref 3.87–5.11)
RDW: 13.9 % (ref 11.5–15.5)
WBC: 4.6 10*3/uL (ref 4.0–10.5)
nRBC: 0 % (ref 0.0–0.2)

## 2023-09-02 LAB — COMPREHENSIVE METABOLIC PANEL
ALT: 15 U/L (ref 0–44)
AST: 22 U/L (ref 15–41)
Albumin: 4 g/dL (ref 3.5–5.0)
Alkaline Phosphatase: 94 U/L (ref 38–126)
Anion gap: 11 (ref 5–15)
BUN: 23 mg/dL (ref 8–23)
CO2: 24 mmol/L (ref 22–32)
Calcium: 9.1 mg/dL (ref 8.9–10.3)
Chloride: 99 mmol/L (ref 98–111)
Creatinine, Ser: 0.36 mg/dL — ABNORMAL LOW (ref 0.44–1.00)
GFR, Estimated: >60 mL/min (ref >60)
Glucose, Bld: 140 mg/dL — ABNORMAL HIGH (ref 70–99)
Potassium: 3.4 mmol/L — ABNORMAL LOW (ref 3.5–5.1)
Sodium: 134 mmol/L — ABNORMAL LOW (ref 135–145)
Total Bilirubin: 0.5 mg/dL (ref 0.0–1.2)
Total Protein: 7.5 g/dL (ref 6.5–8.1)

## 2023-09-02 LAB — C-REACTIVE PROTEIN: CRP: 0.8 mg/dL (ref <1.0)

## 2023-09-02 LAB — SEDIMENTATION RATE: Sed Rate: 25 mm/h — ABNORMAL HIGH (ref 0–22)

## 2023-09-02 MED ORDER — DOXYCYCLINE HYCLATE 100 MG PO CAPS: 100.0000 mg | ORAL_CAPSULE | Freq: Two times a day (BID) | ORAL | 0 refills | Status: AC

## 2023-09-02 NOTE — ED Triage Notes (Signed)
Pt reports with right hip redness, hardness and drainage. Pt previously had a hip replacement. Pt is to have surgery tomorrow but family does not want the current provider to see pt due to infection rates.

## 2023-09-02 NOTE — ED Notes (Signed)
Patient transported to X-ray 

## 2023-09-02 NOTE — Discharge Instructions (Addendum)
Called tomorrow for an appointment on Friday with Dr. Thad Ranger.

## 2023-09-02 NOTE — ED Provider Notes (Signed)
[place] EMERGENCY DEPARTMENT AT [facility] Provider Note   CSN: [id number] Arrival date & time: 09/02/23  1749     History  Chief Complaint  Patient presents with   Wound Infection    Robyn Lambert is a 83 y.o. female.  HPI Patient presents with right hip infection.  Reportedly hip replacement back in December.  Complicated by cellulitis and abscess.  Had pneumonia also.  Had been on doxycycline twice since then.  Had been seen at urgent care and had retained staple.  Reportedly went the next day to Ortho and found retained staple with more abscess.  Has been on doxycycline.  Followed up back today at Dr. Mittie Bodo office, who did the surgery.  Reportedly plans to take 2 OR tomorrow for exploration.  However family does not want to see him anymore.  They came and went to the [facility] office appear.  Reviewed emergent notes and they state that patient should follow-up with Dr. Mittie Bodo office.  Patient came here hoping to see a different orthopedic surgeon.  They state his infection rates are too high.  She has previously seen [facility] but states with the fall they do not operate down in [place].   Past Medical History:  Diagnosis Date   Anxiety    Arthritis    Cancer (HCC)    Rt hand skin   Dyspnea    REPORTS THIS IS ON EXERTION ONLY , "SOMETIMES BY THE TIME I WALK UPHILL ON MY DRIVEAWAY, MY HEART IS POUNDING AND I AM OUT OF BREATH "    Dysrhythmia    hx PACs    GERD (gastroesophageal reflux disease)    Hyperlipidemia    Hypothyroidism    Joint pain    MULTIPLE ARTHRITIC JOINTS   Palpitations    DAILY , DESCIRBES AS "RACING" DENIES ASSOCIATED SOB, DIZZINESS, NOR CHEST PAIN    Rectocele    Thyroid disease    Past Surgical History:  Procedure Laterality Date   AORTA SURGERY  1971   TRAUMA, 5 1/2 HOUR SURGERY  TO REPAIR THORACIC AORTA. F/U WITH THORACIC PHYSICIAN FOR 1 YEAR POST-OP    CARDIAC CATHERIZATION   2014   DONE IN [facility] ? .  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/2019   Procedure: REVERSE SHOULDER ARTHROPLASTY;  Surgeon: Beverely Low, MD;  Location: [facility];  Service: Orthopedics;  Laterality: Left;  with interscalene block   ROTATOR CUFF REPAIR Right    DR CISCO [place]    TOTAL KNEE ARTHROPLASTY Left 02/28/2015   Procedure: LEFT TOTAL KNEE ARTHROPLASTY;  Surgeon: Ollen Gross, MD;  Location: [facility];  Service: Orthopedics;  Laterality: Left;   TOTAL KNEE ARTHROPLASTY Right 02/20/2020   Procedure: TOTAL KNEE ARTHROPLASTY;  Surgeon: Ollen Gross, MD;  Location: [facility];  Service: Orthopedics;  Laterality: Right;     Home Medications Prior to Admission medications   Medication Sig Start Date End Date Taking? Authorizing Provider  doxycycline (VIBRAMYCIN) 100 MG capsule Take 1 capsule (100 mg total) by mouth 2 (two)  times daily. 09/02/23  Yes Benjiman Core, MD  acetaminophen (TYLENOL) 650 MG CR tablet Take 650-1,300 mg by mouth every 8 (eight) hours as needed for pain.    [provider]  Ascorbic Acid (VITAMIN C) 1000 MG tablet Take 1,000 mg by mouth daily.    [provider]  BIOTIN PO Take 1 capsule by mouth daily.    [provider]  CALCIUM PO Take 3,000 mg by mouth at bedtime. 1000 mg/capsule    [provider]  Cholecalciferol (VITAMIN D3) 125 MCG (5000 UT) TABS Take 5,000 Units by mouth daily.    [provider]  gabapentin (NEURONTIN) 100 MG capsule Take a 100 mg capsule three times a day for two weeks following surgery.Then take a 100 mg capsule two times a day for two weeks. Then take a 100 mg capsule once a day for two weeks. Then  discontinue. 02/21/20   Edmisten, Kristie L, PA  Glucosamine-Chondroitin (COSAMIN DS PO) Take 2 tablets by mouth daily.    [provider]  levothyroxine (SYNTHROID) 88 MCG tablet Take 88 mcg by mouth every evening. 12/26/19   [provider]  LINOLEIC ACID-SUNFLOWER OIL PO Take 2,400 mg by mouth at bedtime. 1200 mg/capsule    [provider]  Melatonin 10 MG TABS Take 10 mg by mouth at bedtime.     [provider]  methocarbamol (ROBAXIN) 500 MG tablet Take 1 tablet (500 mg total) by mouth every 6 (six) hours as needed for muscle spasms. 02/21/20   Edmisten, Lyn Hollingshead, PA  Multiple Vitamin (MULTIVITAMIN WITH MINERALS) TABS tablet Take 1 tablet by mouth daily.    [provider]  NAT-RUL PSYLLIUM SEED HUSKS PO Take 3 capsules by mouth at bedtime.    [provider]  Omega-3 Fatty Acids (FISH OIL PO) Take 2,500 mg by mouth at bedtime.    [provider]  OVER THE COUNTER MEDICATION Take 2 tablets by mouth daily as needed (anxiety.). [facility]     [provider]  oxyCODONE (OXY IR/ROXICODONE) 5 MG immediate release tablet Take 1-2 tablets (5-10 mg total) by mouth every 6 (six) hours as needed for severe pain. 02/21/20   Edmisten, Kristie L, PA  polyethylene glycol (MIRALAX / GLYCOLAX) 17 g packet Take 8.5 g by mouth daily.    [provider]  Probiotic Product (PROBIOTIC PO) Take 1 capsule by mouth daily after breakfast.    [provider]  QUERCETIN PO Take 500 mg by mouth daily.    [provider]  Red Yeast Rice 600 MG TABS Take 600 mg by mouth daily.    [provider]  Specialty Vitamins Products (BRAIN) TABS Take 2 tablets by mouth daily. Curamed Brain    [provider]  traMADol (ULTRAM) 50 MG tablet Take 1-2 tablets (50-100 mg total) by mouth every 6 (six) hours as needed for moderate pain. 02/21/20   Edmisten, Lyn Hollingshead, PA  Vitamin D-Vitamin K (K2 PLUS D3 PO) Take 1 tablet by  mouth daily.    [provider]  Zinc 25 MG TABS Take 25 mg by mouth daily.    [provider]      Allergies    Sulfa antibiotics    Review of Systems   Review of Systems  Physical Exam Updated Vital Signs BP (!) 171/79   Pulse 93   Temp 97.7 F (36.5 C) (Oral)   Resp 18   Ht 5\' 4"  (1.626 m)  Wt 81.6 kg   SpO2 95%   BMI 30.88 kg/m  Physical Exam Vitals and nursing note reviewed.  Cardiovascular:     Rate and Rhythm: Tachycardia present.  Chest:     Chest wall: No tenderness.  Abdominal:     Tenderness: There is no abdominal tenderness.  Musculoskeletal:        General: Tenderness present.     Comments: Tenderness to the right anterior hip.  May have small amount of fluctuance superiorly over the wound.  No drainage.  Decent range of motion in the hip.  Neurological:     Mental Status: She is alert.     ED Results / Procedures / Treatments   Labs (all labs ordered are listed, but only abnormal results are displayed) Labs Reviewed  COMPREHENSIVE METABOLIC PANEL - Abnormal; Notable for the following components:      Result Value   Sodium 134 (*)    Potassium 3.4 (*)    Glucose, Bld 140 (*)    Creatinine, Ser 0.36 (*)    All other components within normal limits  SEDIMENTATION RATE - Abnormal; Notable for the following components:   Sed Rate 25 (*)    All other components within normal limits  CBC WITH DIFFERENTIAL/PLATELET  C-REACTIVE PROTEIN    EKG None  Radiology DG Hip Unilat W or Wo Pelvis 2-3 Views Right Result Date: 09/02/2023 CLINICAL DATA:  Status posts recent right hip replacement. Concern for infection. EXAM: DG HIP (WITH OR WITHOUT PELVIS) 2-3V RIGHT COMPARISON:  Right hip radiograph dated 06/27/2023. FINDINGS: There is a right hip arthroplasty. The arthroplasty components appear intact. There is no acute fracture or dislocation. The soft tissues are grossly unremarkable. IMPRESSION: Right hip arthroplasty. No acute fracture or  dislocation. Electronically Signed   By: Elgie Collard M.D.   On: 09/02/2023 20:34    Procedures Procedures    Medications Ordered in ED Medications - No data to display  ED Course/ Medical Decision Making/ A&P                                 Medical Decision Making Amount and/or Complexity of Data Reviewed Labs: ordered. Radiology: ordered.  Risk Prescription drug management.   Patient with potential cellulitis versus abscess of surgical wound after hip replacement.  White count reassuring.  Afebrile but does have mild tachycardia.  Had follow-up with orthopedic surgery but family does not want that surgeon.  Had seen [facility] but they do not want to see her.  Will get x-ray.  Will discuss with orthopedic surgeon on-call here, Dr. Thad Ranger.  Discussed with Dr. Thad Ranger.  With normal white count do not think we need to change antibiotics.  Will however refill the doxycycline.  Can see her in the clinic either in [place] on Friday or on Wednesday next week in [place].  Patient states she will be seen in 2 days.  Does not appear septic at this time.  Will discharge home.        Final Clinical Impression(s) / ED Diagnoses Final diagnoses:  Cellulitis of right lower extremity    Rx / DC Orders ED Discharge Orders          Ordered    doxycycline (VIBRAMYCIN) 100 MG capsule  2 times daily        09/02/23 2103              Benjiman Core, MD 09/02/23 2120

## 2023-09-02 NOTE — ED Notes (Addendum)
 Robyn Lambert

## 2023-09-04 DIAGNOSIS — Z96649 Presence of unspecified artificial hip joint: Secondary | ICD-10-CM | POA: Diagnosis not present

## 2023-09-04 DIAGNOSIS — T8459XA Infection and inflammatory reaction due to other internal joint prosthesis, initial encounter: Secondary | ICD-10-CM | POA: Diagnosis not present

## 2023-09-07 DIAGNOSIS — Z79899 Other long term (current) drug therapy: Secondary | ICD-10-CM | POA: Diagnosis not present

## 2023-09-07 DIAGNOSIS — E079 Disorder of thyroid, unspecified: Secondary | ICD-10-CM | POA: Diagnosis not present

## 2023-09-07 DIAGNOSIS — E7800 Pure hypercholesterolemia, unspecified: Secondary | ICD-10-CM | POA: Diagnosis not present

## 2023-09-07 DIAGNOSIS — T8141XA Infection following a procedure, superficial incisional surgical site, initial encounter: Secondary | ICD-10-CM | POA: Diagnosis not present

## 2023-09-07 DIAGNOSIS — Z882 Allergy status to sulfonamides status: Secondary | ICD-10-CM | POA: Diagnosis not present

## 2023-09-07 DIAGNOSIS — Z01810 Encounter for preprocedural cardiovascular examination: Secondary | ICD-10-CM | POA: Diagnosis not present

## 2023-09-07 DIAGNOSIS — T8459XA Infection and inflammatory reaction due to other internal joint prosthesis, initial encounter: Secondary | ICD-10-CM | POA: Diagnosis not present

## 2023-09-07 DIAGNOSIS — L02415 Cutaneous abscess of right lower limb: Secondary | ICD-10-CM | POA: Diagnosis not present

## 2023-09-07 DIAGNOSIS — Z7982 Long term (current) use of aspirin: Secondary | ICD-10-CM | POA: Diagnosis not present

## 2023-09-17 DIAGNOSIS — Z1231 Encounter for screening mammogram for malignant neoplasm of breast: Secondary | ICD-10-CM | POA: Diagnosis not present

## 2023-09-17 DIAGNOSIS — Z803 Family history of malignant neoplasm of breast: Secondary | ICD-10-CM | POA: Diagnosis not present

## 2023-09-28 DIAGNOSIS — N6012 Diffuse cystic mastopathy of left breast: Secondary | ICD-10-CM | POA: Diagnosis not present

## 2023-09-28 DIAGNOSIS — N6011 Diffuse cystic mastopathy of right breast: Secondary | ICD-10-CM | POA: Diagnosis not present

## 2023-09-29 DIAGNOSIS — M25551 Pain in right hip: Secondary | ICD-10-CM | POA: Diagnosis not present

## 2023-09-29 DIAGNOSIS — M62551 Muscle wasting and atrophy, not elsewhere classified, right thigh: Secondary | ICD-10-CM | POA: Diagnosis not present

## 2023-09-29 DIAGNOSIS — R2689 Other abnormalities of gait and mobility: Secondary | ICD-10-CM | POA: Diagnosis not present

## 2023-10-02 DIAGNOSIS — M25551 Pain in right hip: Secondary | ICD-10-CM | POA: Diagnosis not present

## 2023-10-02 DIAGNOSIS — M62551 Muscle wasting and atrophy, not elsewhere classified, right thigh: Secondary | ICD-10-CM | POA: Diagnosis not present

## 2023-10-02 DIAGNOSIS — R2689 Other abnormalities of gait and mobility: Secondary | ICD-10-CM | POA: Diagnosis not present

## 2023-10-05 DIAGNOSIS — M25551 Pain in right hip: Secondary | ICD-10-CM | POA: Diagnosis not present

## 2023-10-05 DIAGNOSIS — M62551 Muscle wasting and atrophy, not elsewhere classified, right thigh: Secondary | ICD-10-CM | POA: Diagnosis not present

## 2023-10-05 DIAGNOSIS — R2689 Other abnormalities of gait and mobility: Secondary | ICD-10-CM | POA: Diagnosis not present

## 2023-10-07 DIAGNOSIS — R2689 Other abnormalities of gait and mobility: Secondary | ICD-10-CM | POA: Diagnosis not present

## 2023-10-07 DIAGNOSIS — M25551 Pain in right hip: Secondary | ICD-10-CM | POA: Diagnosis not present

## 2023-10-07 DIAGNOSIS — M62551 Muscle wasting and atrophy, not elsewhere classified, right thigh: Secondary | ICD-10-CM | POA: Diagnosis not present

## 2023-10-12 DIAGNOSIS — R2689 Other abnormalities of gait and mobility: Secondary | ICD-10-CM | POA: Diagnosis not present

## 2023-10-12 DIAGNOSIS — M62551 Muscle wasting and atrophy, not elsewhere classified, right thigh: Secondary | ICD-10-CM | POA: Diagnosis not present

## 2023-10-12 DIAGNOSIS — M25551 Pain in right hip: Secondary | ICD-10-CM | POA: Diagnosis not present

## 2023-10-14 DIAGNOSIS — R2689 Other abnormalities of gait and mobility: Secondary | ICD-10-CM | POA: Diagnosis not present

## 2023-10-14 DIAGNOSIS — M25551 Pain in right hip: Secondary | ICD-10-CM | POA: Diagnosis not present

## 2023-10-14 DIAGNOSIS — M62551 Muscle wasting and atrophy, not elsewhere classified, right thigh: Secondary | ICD-10-CM | POA: Diagnosis not present

## 2023-10-19 DIAGNOSIS — M62551 Muscle wasting and atrophy, not elsewhere classified, right thigh: Secondary | ICD-10-CM | POA: Diagnosis not present

## 2023-10-19 DIAGNOSIS — R2689 Other abnormalities of gait and mobility: Secondary | ICD-10-CM | POA: Diagnosis not present

## 2023-10-19 DIAGNOSIS — M25551 Pain in right hip: Secondary | ICD-10-CM | POA: Diagnosis not present

## 2023-10-23 DIAGNOSIS — M62551 Muscle wasting and atrophy, not elsewhere classified, right thigh: Secondary | ICD-10-CM | POA: Diagnosis not present

## 2023-10-23 DIAGNOSIS — R2689 Other abnormalities of gait and mobility: Secondary | ICD-10-CM | POA: Diagnosis not present

## 2023-10-23 DIAGNOSIS — M25551 Pain in right hip: Secondary | ICD-10-CM | POA: Diagnosis not present

## 2023-10-27 DIAGNOSIS — R2689 Other abnormalities of gait and mobility: Secondary | ICD-10-CM | POA: Diagnosis not present

## 2023-10-27 DIAGNOSIS — M25551 Pain in right hip: Secondary | ICD-10-CM | POA: Diagnosis not present

## 2023-10-27 DIAGNOSIS — M62551 Muscle wasting and atrophy, not elsewhere classified, right thigh: Secondary | ICD-10-CM | POA: Diagnosis not present

## 2023-10-30 DIAGNOSIS — R2689 Other abnormalities of gait and mobility: Secondary | ICD-10-CM | POA: Diagnosis not present

## 2023-10-30 DIAGNOSIS — M25551 Pain in right hip: Secondary | ICD-10-CM | POA: Diagnosis not present

## 2023-10-30 DIAGNOSIS — M62551 Muscle wasting and atrophy, not elsewhere classified, right thigh: Secondary | ICD-10-CM | POA: Diagnosis not present

## 2023-11-03 DIAGNOSIS — R2689 Other abnormalities of gait and mobility: Secondary | ICD-10-CM | POA: Diagnosis not present

## 2023-11-03 DIAGNOSIS — M25551 Pain in right hip: Secondary | ICD-10-CM | POA: Diagnosis not present

## 2023-11-03 DIAGNOSIS — M62551 Muscle wasting and atrophy, not elsewhere classified, right thigh: Secondary | ICD-10-CM | POA: Diagnosis not present

## 2023-11-05 DIAGNOSIS — R2689 Other abnormalities of gait and mobility: Secondary | ICD-10-CM | POA: Diagnosis not present

## 2023-11-05 DIAGNOSIS — M25551 Pain in right hip: Secondary | ICD-10-CM | POA: Diagnosis not present

## 2023-11-05 DIAGNOSIS — M62551 Muscle wasting and atrophy, not elsewhere classified, right thigh: Secondary | ICD-10-CM | POA: Diagnosis not present

## 2023-11-11 DIAGNOSIS — M62551 Muscle wasting and atrophy, not elsewhere classified, right thigh: Secondary | ICD-10-CM | POA: Diagnosis not present

## 2023-11-11 DIAGNOSIS — M25551 Pain in right hip: Secondary | ICD-10-CM | POA: Diagnosis not present

## 2023-11-11 DIAGNOSIS — R2689 Other abnormalities of gait and mobility: Secondary | ICD-10-CM | POA: Diagnosis not present

## 2023-11-13 DIAGNOSIS — M25551 Pain in right hip: Secondary | ICD-10-CM | POA: Diagnosis not present

## 2023-11-13 DIAGNOSIS — M62551 Muscle wasting and atrophy, not elsewhere classified, right thigh: Secondary | ICD-10-CM | POA: Diagnosis not present

## 2023-11-13 DIAGNOSIS — R2689 Other abnormalities of gait and mobility: Secondary | ICD-10-CM | POA: Diagnosis not present

## 2023-11-17 DIAGNOSIS — M62551 Muscle wasting and atrophy, not elsewhere classified, right thigh: Secondary | ICD-10-CM | POA: Diagnosis not present

## 2023-11-17 DIAGNOSIS — M25551 Pain in right hip: Secondary | ICD-10-CM | POA: Diagnosis not present

## 2023-11-17 DIAGNOSIS — R2689 Other abnormalities of gait and mobility: Secondary | ICD-10-CM | POA: Diagnosis not present

## 2023-11-20 DIAGNOSIS — M62551 Muscle wasting and atrophy, not elsewhere classified, right thigh: Secondary | ICD-10-CM | POA: Diagnosis not present

## 2023-11-20 DIAGNOSIS — M25551 Pain in right hip: Secondary | ICD-10-CM | POA: Diagnosis not present

## 2023-11-20 DIAGNOSIS — R2689 Other abnormalities of gait and mobility: Secondary | ICD-10-CM | POA: Diagnosis not present

## 2023-11-24 DIAGNOSIS — M25551 Pain in right hip: Secondary | ICD-10-CM | POA: Diagnosis not present

## 2023-11-24 DIAGNOSIS — M62551 Muscle wasting and atrophy, not elsewhere classified, right thigh: Secondary | ICD-10-CM | POA: Diagnosis not present

## 2023-11-24 DIAGNOSIS — R2689 Other abnormalities of gait and mobility: Secondary | ICD-10-CM | POA: Diagnosis not present

## 2023-12-02 DIAGNOSIS — R2689 Other abnormalities of gait and mobility: Secondary | ICD-10-CM | POA: Diagnosis not present

## 2023-12-02 DIAGNOSIS — M62551 Muscle wasting and atrophy, not elsewhere classified, right thigh: Secondary | ICD-10-CM | POA: Diagnosis not present

## 2023-12-02 DIAGNOSIS — M25551 Pain in right hip: Secondary | ICD-10-CM | POA: Diagnosis not present

## 2023-12-09 DIAGNOSIS — M62551 Muscle wasting and atrophy, not elsewhere classified, right thigh: Secondary | ICD-10-CM | POA: Diagnosis not present

## 2023-12-09 DIAGNOSIS — R2689 Other abnormalities of gait and mobility: Secondary | ICD-10-CM | POA: Diagnosis not present

## 2023-12-09 DIAGNOSIS — M25551 Pain in right hip: Secondary | ICD-10-CM | POA: Diagnosis not present

## 2023-12-16 DIAGNOSIS — R2689 Other abnormalities of gait and mobility: Secondary | ICD-10-CM | POA: Diagnosis not present

## 2023-12-16 DIAGNOSIS — M25551 Pain in right hip: Secondary | ICD-10-CM | POA: Diagnosis not present

## 2023-12-16 DIAGNOSIS — M62551 Muscle wasting and atrophy, not elsewhere classified, right thigh: Secondary | ICD-10-CM | POA: Diagnosis not present

## 2023-12-31 DIAGNOSIS — R2689 Other abnormalities of gait and mobility: Secondary | ICD-10-CM | POA: Diagnosis not present

## 2023-12-31 DIAGNOSIS — M62551 Muscle wasting and atrophy, not elsewhere classified, right thigh: Secondary | ICD-10-CM | POA: Diagnosis not present

## 2023-12-31 DIAGNOSIS — M25551 Pain in right hip: Secondary | ICD-10-CM | POA: Diagnosis not present

## 2024-01-06 DIAGNOSIS — M25551 Pain in right hip: Secondary | ICD-10-CM | POA: Diagnosis not present

## 2024-01-06 DIAGNOSIS — M62551 Muscle wasting and atrophy, not elsewhere classified, right thigh: Secondary | ICD-10-CM | POA: Diagnosis not present

## 2024-01-06 DIAGNOSIS — R2689 Other abnormalities of gait and mobility: Secondary | ICD-10-CM | POA: Diagnosis not present

## 2024-02-24 DIAGNOSIS — L304 Erythema intertrigo: Secondary | ICD-10-CM | POA: Diagnosis not present

## 2024-02-24 DIAGNOSIS — D649 Anemia, unspecified: Secondary | ICD-10-CM | POA: Diagnosis not present

## 2024-02-24 DIAGNOSIS — E039 Hypothyroidism, unspecified: Secondary | ICD-10-CM | POA: Diagnosis not present

## 2024-03-28 DIAGNOSIS — H40053 Ocular hypertension, bilateral: Secondary | ICD-10-CM | POA: Diagnosis not present

## 2024-03-28 DIAGNOSIS — H40013 Open angle with borderline findings, low risk, bilateral: Secondary | ICD-10-CM | POA: Diagnosis not present

## 2024-04-05 DIAGNOSIS — M545 Low back pain, unspecified: Secondary | ICD-10-CM | POA: Diagnosis not present

## 2024-04-05 DIAGNOSIS — M5431 Sciatica, right side: Secondary | ICD-10-CM | POA: Diagnosis not present

## 2024-04-13 DIAGNOSIS — R2689 Other abnormalities of gait and mobility: Secondary | ICD-10-CM | POA: Diagnosis not present

## 2024-04-13 DIAGNOSIS — M545 Low back pain, unspecified: Secondary | ICD-10-CM | POA: Diagnosis not present

## 2024-04-13 DIAGNOSIS — R293 Abnormal posture: Secondary | ICD-10-CM | POA: Diagnosis not present

## 2024-04-19 DIAGNOSIS — R293 Abnormal posture: Secondary | ICD-10-CM | POA: Diagnosis not present

## 2024-04-19 DIAGNOSIS — M545 Low back pain, unspecified: Secondary | ICD-10-CM | POA: Diagnosis not present

## 2024-04-19 DIAGNOSIS — R2689 Other abnormalities of gait and mobility: Secondary | ICD-10-CM | POA: Diagnosis not present

## 2024-04-21 DIAGNOSIS — H18413 Arcus senilis, bilateral: Secondary | ICD-10-CM | POA: Diagnosis not present

## 2024-04-21 DIAGNOSIS — H02831 Dermatochalasis of right upper eyelid: Secondary | ICD-10-CM | POA: Diagnosis not present

## 2024-04-21 DIAGNOSIS — Z961 Presence of intraocular lens: Secondary | ICD-10-CM | POA: Diagnosis not present

## 2024-04-21 DIAGNOSIS — H26491 Other secondary cataract, right eye: Secondary | ICD-10-CM | POA: Diagnosis not present

## 2024-04-25 DIAGNOSIS — R293 Abnormal posture: Secondary | ICD-10-CM | POA: Diagnosis not present

## 2024-04-25 DIAGNOSIS — R2689 Other abnormalities of gait and mobility: Secondary | ICD-10-CM | POA: Diagnosis not present

## 2024-04-27 DIAGNOSIS — R293 Abnormal posture: Secondary | ICD-10-CM | POA: Diagnosis not present

## 2024-04-27 DIAGNOSIS — M545 Low back pain, unspecified: Secondary | ICD-10-CM | POA: Diagnosis not present

## 2024-04-27 DIAGNOSIS — H40013 Open angle with borderline findings, low risk, bilateral: Secondary | ICD-10-CM | POA: Diagnosis not present

## 2024-04-27 DIAGNOSIS — H26491 Other secondary cataract, right eye: Secondary | ICD-10-CM | POA: Diagnosis not present

## 2024-04-27 DIAGNOSIS — H40053 Ocular hypertension, bilateral: Secondary | ICD-10-CM | POA: Diagnosis not present

## 2024-04-27 DIAGNOSIS — R2689 Other abnormalities of gait and mobility: Secondary | ICD-10-CM | POA: Diagnosis not present

## 2024-05-02 DIAGNOSIS — R2689 Other abnormalities of gait and mobility: Secondary | ICD-10-CM | POA: Diagnosis not present

## 2024-05-02 DIAGNOSIS — R293 Abnormal posture: Secondary | ICD-10-CM | POA: Diagnosis not present

## 2024-05-02 DIAGNOSIS — M545 Low back pain, unspecified: Secondary | ICD-10-CM | POA: Diagnosis not present

## 2024-05-04 DIAGNOSIS — R2689 Other abnormalities of gait and mobility: Secondary | ICD-10-CM | POA: Diagnosis not present

## 2024-05-04 DIAGNOSIS — M545 Low back pain, unspecified: Secondary | ICD-10-CM | POA: Diagnosis not present

## 2024-05-04 DIAGNOSIS — R293 Abnormal posture: Secondary | ICD-10-CM | POA: Diagnosis not present

## 2024-05-11 DIAGNOSIS — R2689 Other abnormalities of gait and mobility: Secondary | ICD-10-CM | POA: Diagnosis not present

## 2024-05-11 DIAGNOSIS — R293 Abnormal posture: Secondary | ICD-10-CM | POA: Diagnosis not present

## 2024-05-11 DIAGNOSIS — M545 Low back pain, unspecified: Secondary | ICD-10-CM | POA: Diagnosis not present

## 2024-05-13 DIAGNOSIS — M545 Low back pain, unspecified: Secondary | ICD-10-CM | POA: Diagnosis not present

## 2024-05-13 DIAGNOSIS — R293 Abnormal posture: Secondary | ICD-10-CM | POA: Diagnosis not present

## 2024-05-13 DIAGNOSIS — R2689 Other abnormalities of gait and mobility: Secondary | ICD-10-CM | POA: Diagnosis not present

## 2024-05-18 DIAGNOSIS — R2689 Other abnormalities of gait and mobility: Secondary | ICD-10-CM | POA: Diagnosis not present

## 2024-05-18 DIAGNOSIS — R293 Abnormal posture: Secondary | ICD-10-CM | POA: Diagnosis not present

## 2024-05-18 DIAGNOSIS — M545 Low back pain, unspecified: Secondary | ICD-10-CM | POA: Diagnosis not present

## 2024-05-20 DIAGNOSIS — R2689 Other abnormalities of gait and mobility: Secondary | ICD-10-CM | POA: Diagnosis not present

## 2024-05-20 DIAGNOSIS — R293 Abnormal posture: Secondary | ICD-10-CM | POA: Diagnosis not present

## 2024-05-20 DIAGNOSIS — M545 Low back pain, unspecified: Secondary | ICD-10-CM | POA: Diagnosis not present
